# Patient Record
Sex: Female | Born: 1980
Health system: Southern US, Community
[De-identification: ages and names within clinical notes are randomized; demographics above are authoritative.]

## PROBLEM LIST (undated history)

## (undated) DIAGNOSIS — F419 Anxiety disorder, unspecified: Secondary | ICD-10-CM

## (undated) DIAGNOSIS — N760 Acute vaginitis: Secondary | ICD-10-CM

## (undated) DIAGNOSIS — O24419 Gestational diabetes mellitus in pregnancy, unspecified control: Secondary | ICD-10-CM

## (undated) DIAGNOSIS — I471 Supraventricular tachycardia, unspecified: Secondary | ICD-10-CM

## (undated) DIAGNOSIS — B9689 Other specified bacterial agents as the cause of diseases classified elsewhere: Secondary | ICD-10-CM

## (undated) DIAGNOSIS — J40 Bronchitis, not specified as acute or chronic: Secondary | ICD-10-CM

## (undated) DIAGNOSIS — I499 Cardiac arrhythmia, unspecified: Secondary | ICD-10-CM

## (undated) DIAGNOSIS — I10 Essential (primary) hypertension: Secondary | ICD-10-CM

## (undated) DIAGNOSIS — J45909 Unspecified asthma, uncomplicated: Secondary | ICD-10-CM

## (undated) DIAGNOSIS — E119 Type 2 diabetes mellitus without complications: Secondary | ICD-10-CM

## (undated) DIAGNOSIS — E663 Overweight: Secondary | ICD-10-CM

## (undated) DIAGNOSIS — E7800 Pure hypercholesterolemia, unspecified: Secondary | ICD-10-CM

## (undated) HISTORY — PX: INDUCED ABORTION: SHX677

## (undated) HISTORY — DX: Overweight: E66.3

## (undated) HISTORY — PX: TONSILLECTOMY: SUR1361

## (undated) HISTORY — DX: Supraventricular tachycardia, unspecified: I47.10

## (undated) HISTORY — DX: Type 2 diabetes mellitus without complications: E11.9

## (undated) HISTORY — DX: Supraventricular tachycardia: I47.1

## (undated) HISTORY — PX: TUBAL LIGATION: SHX77

---
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
## 2000-05-03 ENCOUNTER — Emergency Department (HOSPITAL_COMMUNITY): Admission: EM | Admit: 2000-05-03 | Discharge: 2000-05-03 | Payer: Self-pay

## 2000-06-21 ENCOUNTER — Emergency Department (HOSPITAL_COMMUNITY): Admission: EM | Admit: 2000-06-21 | Discharge: 2000-06-21 | Payer: Self-pay | Admitting: Emergency Medicine

## 2000-06-23 ENCOUNTER — Emergency Department (HOSPITAL_COMMUNITY): Admission: EM | Admit: 2000-06-23 | Discharge: 2000-06-23 | Payer: Self-pay | Admitting: Emergency Medicine

## 2000-11-10 ENCOUNTER — Emergency Department (HOSPITAL_COMMUNITY): Admission: EM | Admit: 2000-11-10 | Discharge: 2000-11-10 | Payer: Self-pay | Admitting: *Deleted

## 2002-01-21 ENCOUNTER — Emergency Department (HOSPITAL_COMMUNITY): Admission: EM | Admit: 2002-01-21 | Discharge: 2002-01-21 | Payer: Self-pay | Admitting: Emergency Medicine

## 2002-08-12 ENCOUNTER — Emergency Department (HOSPITAL_COMMUNITY): Admission: EM | Admit: 2002-08-12 | Discharge: 2002-08-13 | Payer: Self-pay | Admitting: Emergency Medicine

## 2003-04-17 ENCOUNTER — Inpatient Hospital Stay (HOSPITAL_COMMUNITY): Admission: AD | Admit: 2003-04-17 | Discharge: 2003-04-17 | Payer: Self-pay | Admitting: Obstetrics & Gynecology

## 2004-11-22 ENCOUNTER — Emergency Department (HOSPITAL_COMMUNITY): Admission: EM | Admit: 2004-11-22 | Discharge: 2004-11-22 | Payer: Self-pay | Admitting: Emergency Medicine

## 2005-05-06 ENCOUNTER — Emergency Department (HOSPITAL_COMMUNITY): Admission: EM | Admit: 2005-05-06 | Discharge: 2005-05-06 | Payer: Self-pay | Admitting: Emergency Medicine

## 2006-03-15 ENCOUNTER — Other Ambulatory Visit: Admission: RE | Admit: 2006-03-15 | Discharge: 2006-03-15 | Payer: Self-pay | Admitting: Obstetrics and Gynecology

## 2006-05-03 ENCOUNTER — Ambulatory Visit (HOSPITAL_COMMUNITY): Admission: RE | Admit: 2006-05-03 | Discharge: 2006-05-03 | Payer: Self-pay | Admitting: Obstetrics and Gynecology

## 2006-06-15 ENCOUNTER — Emergency Department (HOSPITAL_COMMUNITY): Admission: EM | Admit: 2006-06-15 | Discharge: 2006-06-15 | Payer: Self-pay | Admitting: Emergency Medicine

## 2006-07-03 ENCOUNTER — Encounter: Admission: RE | Admit: 2006-07-03 | Discharge: 2006-08-10 | Payer: Self-pay | Admitting: Obstetrics and Gynecology

## 2006-08-10 ENCOUNTER — Inpatient Hospital Stay (HOSPITAL_COMMUNITY): Admission: AD | Admit: 2006-08-10 | Discharge: 2006-08-10 | Payer: Self-pay | Admitting: Obstetrics and Gynecology

## 2006-08-11 ENCOUNTER — Inpatient Hospital Stay (HOSPITAL_COMMUNITY): Admission: AD | Admit: 2006-08-11 | Discharge: 2006-08-15 | Payer: Self-pay | Admitting: Obstetrics and Gynecology

## 2006-08-12 ENCOUNTER — Encounter (INDEPENDENT_AMBULATORY_CARE_PROVIDER_SITE_OTHER): Payer: Self-pay | Admitting: Specialist

## 2006-09-21 ENCOUNTER — Other Ambulatory Visit: Admission: RE | Admit: 2006-09-21 | Discharge: 2006-09-21 | Payer: Self-pay | Admitting: Obstetrics and Gynecology

## 2007-01-23 ENCOUNTER — Other Ambulatory Visit: Admission: RE | Admit: 2007-01-23 | Discharge: 2007-01-23 | Payer: Self-pay | Admitting: Obstetrics and Gynecology

## 2007-02-02 ENCOUNTER — Inpatient Hospital Stay (HOSPITAL_COMMUNITY): Admission: AD | Admit: 2007-02-02 | Discharge: 2007-02-02 | Payer: Self-pay | Admitting: Obstetrics and Gynecology

## 2007-04-09 ENCOUNTER — Ambulatory Visit (HOSPITAL_COMMUNITY): Admission: RE | Admit: 2007-04-09 | Discharge: 2007-04-09 | Payer: Self-pay | Admitting: Obstetrics and Gynecology

## 2007-04-30 ENCOUNTER — Ambulatory Visit (HOSPITAL_COMMUNITY): Admission: RE | Admit: 2007-04-30 | Discharge: 2007-04-30 | Payer: Self-pay | Admitting: Obstetrics and Gynecology

## 2007-05-31 ENCOUNTER — Encounter: Admission: RE | Admit: 2007-05-31 | Discharge: 2007-06-01 | Payer: Self-pay | Admitting: Obstetrics and Gynecology

## 2007-08-03 ENCOUNTER — Inpatient Hospital Stay (HOSPITAL_COMMUNITY): Admission: AD | Admit: 2007-08-03 | Discharge: 2007-08-06 | Payer: Self-pay | Admitting: Obstetrics and Gynecology

## 2007-08-03 ENCOUNTER — Encounter (INDEPENDENT_AMBULATORY_CARE_PROVIDER_SITE_OTHER): Payer: Self-pay | Admitting: Obstetrics and Gynecology

## 2007-08-10 ENCOUNTER — Inpatient Hospital Stay (HOSPITAL_COMMUNITY): Admission: AD | Admit: 2007-08-10 | Discharge: 2007-08-10 | Payer: Self-pay | Admitting: Obstetrics and Gynecology

## 2007-08-11 ENCOUNTER — Inpatient Hospital Stay (HOSPITAL_COMMUNITY): Admission: AD | Admit: 2007-08-11 | Discharge: 2007-08-11 | Payer: Self-pay | Admitting: Obstetrics and Gynecology

## 2007-08-12 ENCOUNTER — Inpatient Hospital Stay (HOSPITAL_COMMUNITY): Admission: AD | Admit: 2007-08-12 | Discharge: 2007-08-12 | Payer: Self-pay | Admitting: Obstetrics and Gynecology

## 2008-08-14 ENCOUNTER — Emergency Department (HOSPITAL_COMMUNITY): Admission: EM | Admit: 2008-08-14 | Discharge: 2008-08-14 | Payer: Self-pay | Admitting: Emergency Medicine

## 2008-10-01 ENCOUNTER — Emergency Department (HOSPITAL_COMMUNITY): Admission: EM | Admit: 2008-10-01 | Discharge: 2008-10-01 | Payer: Self-pay | Admitting: Emergency Medicine

## 2009-06-25 ENCOUNTER — Emergency Department (HOSPITAL_COMMUNITY): Admission: EM | Admit: 2009-06-25 | Discharge: 2009-06-26 | Payer: Self-pay | Admitting: Emergency Medicine

## 2009-08-20 ENCOUNTER — Emergency Department (HOSPITAL_COMMUNITY): Admission: EM | Admit: 2009-08-20 | Discharge: 2009-08-21 | Payer: Self-pay | Admitting: Emergency Medicine

## 2010-06-24 ENCOUNTER — Emergency Department (HOSPITAL_COMMUNITY)
Admission: EM | Admit: 2010-06-24 | Discharge: 2010-06-24 | Payer: Self-pay | Source: Home / Self Care | Admitting: Emergency Medicine

## 2010-08-19 ENCOUNTER — Emergency Department (HOSPITAL_COMMUNITY): Payer: Self-pay

## 2010-08-19 ENCOUNTER — Emergency Department (HOSPITAL_COMMUNITY)
Admission: EM | Admit: 2010-08-19 | Discharge: 2010-08-19 | Disposition: A | Discharge status: Home / Self Care | Payer: Self-pay | Attending: Emergency Medicine | Admitting: Emergency Medicine

## 2010-08-19 DIAGNOSIS — R05 Cough: Secondary | ICD-10-CM | POA: Insufficient documentation

## 2010-08-19 DIAGNOSIS — R509 Fever, unspecified: Secondary | ICD-10-CM | POA: Insufficient documentation

## 2010-08-19 DIAGNOSIS — F17200 Nicotine dependence, unspecified, uncomplicated: Secondary | ICD-10-CM | POA: Insufficient documentation

## 2010-08-19 DIAGNOSIS — R070 Pain in throat: Secondary | ICD-10-CM | POA: Insufficient documentation

## 2010-08-19 DIAGNOSIS — J40 Bronchitis, not specified as acute or chronic: Secondary | ICD-10-CM | POA: Insufficient documentation

## 2010-08-19 DIAGNOSIS — R059 Cough, unspecified: Secondary | ICD-10-CM | POA: Insufficient documentation

## 2010-08-19 LAB — POCT PREGNANCY, URINE: Preg Test, Ur: NEGATIVE

## 2010-08-28 ENCOUNTER — Emergency Department (HOSPITAL_COMMUNITY)
Admission: EM | Admit: 2010-08-28 | Discharge: 2010-08-28 | Disposition: A | Discharge status: Home / Self Care | Payer: Self-pay | Attending: Emergency Medicine | Admitting: Emergency Medicine

## 2010-08-28 DIAGNOSIS — R059 Cough, unspecified: Secondary | ICD-10-CM | POA: Insufficient documentation

## 2010-08-28 DIAGNOSIS — R05 Cough: Secondary | ICD-10-CM | POA: Insufficient documentation

## 2010-08-28 DIAGNOSIS — F17200 Nicotine dependence, unspecified, uncomplicated: Secondary | ICD-10-CM | POA: Insufficient documentation

## 2010-08-28 DIAGNOSIS — J3489 Other specified disorders of nose and nasal sinuses: Secondary | ICD-10-CM | POA: Insufficient documentation

## 2010-08-28 DIAGNOSIS — J069 Acute upper respiratory infection, unspecified: Secondary | ICD-10-CM | POA: Insufficient documentation

## 2010-08-28 DIAGNOSIS — R062 Wheezing: Secondary | ICD-10-CM | POA: Insufficient documentation

## 2010-09-29 ENCOUNTER — Inpatient Hospital Stay (INDEPENDENT_AMBULATORY_CARE_PROVIDER_SITE_OTHER)
Admission: RE | Admit: 2010-09-29 | Discharge: 2010-09-29 | Disposition: A | Discharge status: Home / Self Care | Payer: Self-pay | Source: Ambulatory Visit | Attending: Family Medicine | Admitting: Family Medicine

## 2010-09-29 DIAGNOSIS — J40 Bronchitis, not specified as acute or chronic: Secondary | ICD-10-CM

## 2010-09-29 DIAGNOSIS — J069 Acute upper respiratory infection, unspecified: Secondary | ICD-10-CM

## 2010-10-26 NOTE — Op Note (Signed)
NAME:  Valerie [name], Valerie [name] NO.:  [id number]   MEDICAL RECORD NO.:  [id number]          PATIENT TYPE:  INP   LOCATION:  9118                          FACILITY:  [facility]   PHYSICIAN:  Randye Lobo, M.D.   DATE OF BIRTH:  April 08, 1981   DATE OF PROCEDURE:  08/03/2007  DATE OF DISCHARGE:                               OPERATIVE REPORT   PREOPERATIVE DIAGNOSIS:  1. Intrauterine gestation at 36 weeks.  2. Latent phase of labor.  3. Gestational diabetes mellitus, insulin requiring.  4. Homero Fellers breech presentation.  5. Desire for permanent sterilization.   POSTOPERATIVE DIAGNOSIS:  1. Intrauterine gestation at 36 weeks.  2. Latent phase of labor.  3. Gestational diabetes mellitus, insulin requiring.  4. Homero Fellers breech presentation.  5. Desire for permanent sterilization.   PROCEDURE:  Repeat low transverse cesarean section with small T incision  into the uterine fundus with bilateral tubal ligation modified Pomeroy  technique.   SURGEON:  Randye Lobo, M.D.   ASSISTANT:  Miguel Aschoff, M.D.   ANESTHESIA:  Spinal.   INTRAVENOUS FLUIDS:  2300 mL Ringer's lactate.   ESTIMATED BLOOD LOSS:  700 mL.   URINE OUTPUT:  200 mL.   COMPLICATIONS:  None.   INDICATIONS FOR PROCEDURE:  The patient is a 30 year old para 1  Caucasian female at 28 weeks' gestation with known gestational diabetes  mellitus controlled with insulin who presents with contractions.  The  patient does have a history of prior cesarean section and she was  planning on a repeat cesarean section and permanent sterilization at 39  weeks' gestation.  When the patient presented, she was noted to have  elevated blood pressure with systolic blood pressure in the 140 range  and diastolics in the 100 range.  The patient was found to be  contracting regularly and with cervical dilation which was 1 cm and 70%  effaced.  The fetal position could not be confirmed.  The patient  expressed a desire for a trial of the  vaginal delivery if the fetus were  in a vertex presentation.  The patient went on to have pre-eclampsia  laboratory studies which were all negative and her urine demonstrated no  protein.  An ultrasound confirmed a frank breech presentation.  A plan  was made to proceed with the patient's repeat cesarean section with  bilateral tubal ligation after risks, benefits, and alternatives were  reviewed.  The patient understands that the tubal ligation is a  permanent procedure which also may result in a failure rate of  approximately 1 in 250 and 1 in 300.  She understands that this could  result in either an intrauterine or ectopic pregnancy, and she chooses  to proceed.   FINDINGS:  A viable female was delivered at 11:17 a.m. without Apgars of 7  at 1 minute and 9 at 5 minutes.  The presentation was frank breech.  The  weight was 6 pounds 12 ounces.  The amniotic fluid was clear.  The  uterus, tubes and ovaries were unremarkable.   SPECIMENS:  A portion of the right and left fallopian tubes were sent to  pathology and the placenta was sent to pathology, as well.   PROCEDURE IN DETAIL:  The patient was reidentified in the maternity  admissions area.  She was brought down to the operating room where a  spinal anesthetic was administered.  She received Ancef 1 gram IV for  antibiotic prophylaxis.  The abdomen was sterilely prepped and a Foley  catheter was sterilely placed inside the bladder.  She was sterilely  draped.   A Pfannenstiel incision was created sharply with a scalpel.  The  incision was carried down to the fascia using sharp dissection and  monopolar cautery for hemostasis.  The fascia was incised in the midline  and the incision was extended bilaterally with the Mayo scissors.  The  rectus muscles were dissected off of the fascia superiorly and  inferiorly.  The rectus muscles were sharply divided in the midline.  The parietal peritoneum was elevated with two hemostat clamps  and  entered sharply.  The peritoneal incision was extended cranially and  caudally.  There were small adhesions of the bladder over the lower  uterine segment which were lysed with a combination of sharp and blunt  dissection.  The bladder flap was then sharply created and the lower  uterine segment was exposed.  A transverse lower uterine segment  incision was performed sharply with a scalpel and the incision was  extended bilaterally in an upward fashion with the bandage scissors.   A hand was inserted through the uterine incision and the breech was  delivered through the incision with the assistance of fundal pressure.  Each of the legs were delivered and the arms were rotated across the  chest.  Next, an attempt was made for delivery of the vertex which  needed to be facilitated by a small T incision of the uterine incision  into the fundal region.  This allowed for delivery.  The umbilical cord  was doubly clamped and cut and the newborn was handed over to the  awaiting pediatricians.  The placenta was manually extracted and sent to  pathology.   The uterus was exteriorized at this time. It was wiped clean with a  moistened lap pad.  The uterine incision was closed with a double layer  closure of #1 chromic.  The first was a running locked layer and the  second was an imbricating layer.  The small extension of the uterine  incision into the fundus could be easily closed with the standard  closure of the uterus, as the extension was very small.  There was a  small defect noted in the myometrium of the lower uterine segment and  this was repaired with a vertical mattress suture of #1 chromic.   Attention was then turned to the fallopian tubes for the tubal ligation.  The right fallopian tube was grasped with a Babcock clamp and followed  all the way to its fimbriated end.  Two free ties of 0 plain gut suture  were then placed along the isthmic portion of the fallopian tube and  the  intervening portion of the tube was sharply excised and sent to  pathology.  The same procedure that was performed on the right fallopian  tube was then repeated on the left fallopian tube after it was followed  all the way to its fimbriated end.   The uterus was returned to the peritoneal cavity at this time.  The  tubal sites were re-examined and found to be intact and hemostatic.  The  uterine incision was hemostatic as well, and the abdomen was, therefore,  closed.  The parietal peritoneum was closed with a running suture of 2-0  Vicryl.  The rectus muscles were reapproximated in the midline with  interrupted sutures of #1 chromic.  The fascia was closed with a running  suture of 0 Vicryl.  The subcutaneous layer was irrigated and suctioned  and made hemostatic with monopolar cautery.  The skin was closed with a  subcuticular suture of 3-0 Vicryl.  A sterile bandage was placed over  the incision.  This concluded the patient's procedure.  There were no  complications.  All needle, instrument, and sponge counts were correct.  The patient is escorted to the recovery room in stable and awake  condition.      Randye Lobo, M.D.  Electronically Signed     BES/MEDQ  D:  08/03/2007  T:  08/04/2007  Job:  [id number]

## 2010-10-29 NOTE — Op Note (Signed)
NAME:  Valerie Valerie [name], Valerie [name] NO.:  [id number]   MEDICAL RECORD NO.:  [id number]          PATIENT TYPE:  INP   LOCATION:  9126                          FACILITY:  [facility]   PHYSICIAN:  Ilda Mori, M.D.   DATE OF BIRTH:  January 21, 1981   DATE OF PROCEDURE:  08/12/2006  DATE OF DISCHARGE:                               OPERATIVE REPORT   PREOPERATIVE DIAGNOSES:  1. Failure to progress.  2. Pregnancy 35 weeks.  3. Insulin-dependent gestational diabetes mellitus.   POSTOPERATIVE DIAGNOSES:  1. Failure to progress.  2. Pregnancy 35 weeks.  3. Insulin-dependent gestational diabetes mellitus.   PROCEDURE:  Primary low transverse cesarean section.   SURGEON:  Ilda Mori, M.D.   ASSISTANT:  Bing Neighbors. Sydnee Cabal, M.D.   ANESTHESIA:  Epidural.   SPECIMENS:  Placenta to pathology.   ESTIMATED BLOOD LOSS:  1000 mL.   FINDINGS:  A female infant, Apgar scores 6 and 8, birth weight 5 pounds  8 ounces.   COMPLICATIONS:  None.   INDICATIONS:  This is a 30 year old gravida 2, para 50 female who  presented with preterm cervical dilatation and active labor.  The cervix  was 4 cm dilated, completely effaced.  It was felt that it would be  difficult to inhibit labor at this time and that at 35-1/2 weeks, the  benefit of doing so may be outweighed by the risk of continuing the  pregnancy in a patient with insulin-dependent gestational diabetes.  The  patient requested epidural for pain after which her labor became  ineffective and no cervical change was noted.  IV Pitocin was begun  and  after 6 hours there was still no significant change in the cervix.  Artificial rupture of membranes was performed and another 6 hours passed  with a slow progress to 9 cm dilation.  The patient also began to have  moderate to severe variables with each  contraction.  On evaluation it  was felt that the vertex was arrested, possibly due to acyclitism or  occiposterior  presentation.  An attempt  to manually rotate the vertex  to OA was unsuccessful.  Despite the fact that the patient had an  estimated fetal weight 5-1/2 pounds on ultrasound and was 4 weeks early,  there seemed to be no progress toward vaginal delivery and the decision  was made to proceed with cesarean section.   PROCEDURE:  The patient is taken to the operating room and the epidural  anesthesia that had been used for labor anesthesia was then used for  surgical anesthesia.  The abdomen was prepped and draped in sterile  fashion.  Low transverse incision was made and carried down to the  fascia which was then extended transversely.  The rectus muscle was  divided from the overlying rectus sheath and rectus muscle divided in  the midline.  The peritoneum was entered by blunt dissection and then  enlarged bluntly.  The lower segment was identified and the bladder  plane was incised and the bladder was displaced inferiorly.  The lower  segment was entered sharply.  The head was well down in the pelvis and  was delivered with some difficulty.  The shoulders were then delivered,  also with some difficulty.  The cord bloods were obtained.  Placenta  delivered easily.  The uterus bluntly curettaged.  The lower segment was  closed with running interlocking Vicryl suture.  An extension was noted  at the paravaginal area on the right.  This was closed with running  interlocking Vicryl suture.  Bleeding was noted at the corner of the  incision on the right and this was controlled with a figure-of-eight  suture.  The broad ligament was palpated and the suture did not go into  the broad ligament.  An imbricating stitch was then used to further  close the uterine incision.  Hemostasis was noted present.  The  peritoneum was closed with running 3-0 Vicryl suture which also  reapposed the rectus muscle in the midline.  The fascia was closed with  a running Vicryl suture.  The subcutaneous tissue was reapposed with 2-0  plain  catgut suture and the skin was closed with staples.  The patient  tolerated the procedure well and left the operating room in good  condition.      Ilda Mori, M.D.  Electronically Signed     RK/MEDQ  D:  08/12/2006  T:  08/12/2006  Job:  [id number]   cc:   Fayrene Fearing A. Ashley Royalty, M.D.  Fax: [phone]   Charles A. Sydnee Cabal, MD  Fax: [phone]

## 2010-10-29 NOTE — Discharge Summary (Signed)
NAME:  [name], Valerie [name] NO.:  [id number]   MEDICAL RECORD NO.:  [id number]          PATIENT TYPE:  INP   LOCATION:  9126                          FACILITY:  [facility]   PHYSICIAN:  Rudy Jew. Ashley Royalty, Valerie.D.DATE OF BIRTH:  May 30, 1981   DATE OF ADMISSION:  08/11/2006  DATE OF DISCHARGE:  08/15/2006                               DISCHARGE SUMMARY   DISCHARGE DIAGNOSES:  1. Intrauterine pregnancy at approximately 35-4/7 weeks' gestation,      delivered.  2. Preterm labor.  3. Insulin-dependent gestational diabetes.  4. History of methicillin-resistant Staph aureus on skin abscess from      buttocks.  5. Mild pregnancy-induced hypertension diagnosed by Dr. Arlyce Dice.  6. Arrest disorder of dilatation in labor.   OPERATIONS AND SPECIAL PROCEDURES:  Primary low transverse cesarean  section per Dr. Arlyce Dice.   CONSULTATIONS:  None.   DISCHARGE MEDICATIONS:  1. Percocet.  2. Motrin 600 mg.   HISTORY AND PHYSICAL:  This 30 year old gravida 2, para 0-0-1-0 was  admitted in preterm labor by Dr. Arlyce Dice with the aforementioned  diagnoses.  The patient was given antibiotic prophylaxis for unknown  group B strep status.  She went on to labor and ultimately was diagnosed  with an arrest disorder of dilatation in labor.  She was taken to the  operating room by Dr. Arlyce Dice and underwent primary low transverse  cesarean section on August 12, 2006.  The procedure was uncomplicated.  The patient had some modest and transient blood pressure elevations.  PIH panel was benign.  Her Accu-Cheks were within acceptable ranges.  She was felt to be stable for discharge on August 15, 2006 and was  discharged home, afebrile and in satisfactory condition.   DISPOSITION:  The patient is return to [facility] in approximately 3 days for followup.      James A. Ashley Royalty, Valerie.D.  Electronically Signed     JAM/MEDQ  D:  09/13/2006  T:  09/13/2006  Job:  [id number]

## 2010-10-29 NOTE — Discharge Summary (Signed)
NAME:  Valerie [name], Valerie [name] NO.:  [id number]   MEDICAL RECORD NO.:  [id number]          PATIENT TYPE:  MAT   LOCATION:  [facility]                          FACILITY:  [facility]   PHYSICIAN:  Malva Limes, M.D.    DATE OF BIRTH:  March 09, 1981   DATE OF ADMISSION:  08/03/2007  DATE OF DISCHARGE:  08/06/2007                               DISCHARGE SUMMARY   FINAL DIAGNOSIS:  Intrauterine gestation at 36 weeks, latent phase of  labor, gestational diabetes mellitus insulin requiring, frank breech  presentation, and desires her permanent sterilization.   PROCEDURE:  Repeat low-transverse cesarean section with a small T-  incision into the uterine fundus with bilateral tubal ligation using the  modified Pomeroy technique.   SURGEON:  Randye Lobo, M.D..   ASSISTANT:  Miguel Aschoff, M.D.   COMPLICATIONS:  None.   This 30 year old G3, P0-1-1-1 presents at [redacted] weeks gestation with  contractions.  The patient had had a history of a prior cesarean section  with her last pregnancy secondary to failure to progress.  The patient  is also a known gestational diabetic, requiring insulin.  At her repeat  cesarean section that was already planned to be scheduled later in the  pregnancy, she did want permanent sterilization as well.  Upon  admission, the patient's cervix was about 1 cm dilated, 70% effaced.  The patient did want a VBAC as the fetus was in the vertex position, but  ultrasound confirmed a breech presentation.  At this point, a discussion  was held with the patient, the need to proceed with a cesarean section.  Up to this point, like I said, the patient's antepartum course had been  complicated by a diagnosis of gestational diabetes mellitus.  She was on  insulin and controlled through this.  She has also signed her papers to  receive a postpartum tubal ligation.  She is also a smoker who has been  counseled on gestation throughout her pregnancy.  Otherwise, the  patient's antepartum  course up to this point had been otherwise  uneventful.  The patient was taken to the operating room on August 03, 2007, by Dr. Randye Lobo where a repeat low-transverse cesarean  section with a small T-incision was performed.  The patient had the  delivery of a 6-pound 12-ounce female infant with Apgars of 7 and 9.  The  delivery went without complications, and patient still expressed her  desire for permanent sterilization, which was performed using the  modified Pomeroy technique.  Delivery and postpartum tubal ligation went  without complication.  The patient's postoperative course was mostly  uncomplicated.  She did have some mildly elevated blood pressures, which  were monitored during her postpartum course, but did not seem to get any  worse.  The patient also received some insulin for her gestational  diabetes postoperatively, and her blood sugars were being monitored  frequently.  By postoperative day number 3, the patient was felt ready  for discharge.  She was sent home to follow more of an ADA-type diet and  to call if her blood sugars were above 150.  She was to continue her  prenatal vitamins, was given Percocet 1-2 every 4 hours as needed for  pain and to follow up in our office in 4 weeks.  Instruction and  precautions were reviewed with the patient.   Labs on discharge, the patient had a hemoglobin of 11.3, white blood  cell count of 13.4, platelets of 160,000, and a normal PIH panel.  Group  B strep culture obtained on August 03, 2007, did came back positive as  well.      [name], P.A.-C.    ______________________________  Malva Limes, M.D.    MB/MEDQ  D:  08/29/2007  T:  08/30/2007  Job:  [id number]

## 2011-03-04 LAB — URINE MICROSCOPIC-ADD ON

## 2011-03-04 LAB — COMPREHENSIVE METABOLIC PANEL
ALT: 18
BUN: 5 — ABNORMAL LOW
CO2: 22
Chloride: 102
Potassium: 4.1
Sodium: 134 — ABNORMAL LOW

## 2011-03-04 LAB — URINALYSIS, ROUTINE W REFLEX MICROSCOPIC
Leukocytes, UA: NEGATIVE
Protein, ur: NEGATIVE
pH: 7.5

## 2011-03-04 LAB — CBC
Hemoglobin: 13.8
MCHC: 35
MCHC: 35.3
Platelets: 160
Platelets: 208
RBC: 3.8 — ABNORMAL LOW
RDW: 14.4
WBC: 11.5 — ABNORMAL HIGH
WBC: 13.4 — ABNORMAL HIGH

## 2011-03-04 LAB — URIC ACID: Uric Acid, Serum: 4.8

## 2011-03-04 LAB — STREP B DNA PROBE: Strep Group B Ag: POSITIVE

## 2011-03-04 LAB — RPR: RPR Ser Ql: NONREACTIVE

## 2011-03-04 LAB — WOUND CULTURE

## 2011-03-25 LAB — POCT PREGNANCY, URINE
Operator id: 113551
Preg Test, Ur: POSITIVE

## 2011-03-25 LAB — URINALYSIS, ROUTINE W REFLEX MICROSCOPIC
Glucose, UA: NEGATIVE
Leukocytes, UA: NEGATIVE
pH: 6.5

## 2011-04-27 ENCOUNTER — Inpatient Hospital Stay (HOSPITAL_COMMUNITY): Payer: Self-pay

## 2011-04-27 ENCOUNTER — Encounter (HOSPITAL_COMMUNITY): Payer: Self-pay | Admitting: *Deleted

## 2011-04-27 ENCOUNTER — Inpatient Hospital Stay (HOSPITAL_COMMUNITY)
Admission: AD | Admit: 2011-04-27 | Discharge: 2011-04-27 | Disposition: A | Discharge status: Home / Self Care | Payer: Self-pay | Source: Ambulatory Visit | Attending: Obstetrics & Gynecology | Admitting: Obstetrics & Gynecology

## 2011-04-27 DIAGNOSIS — A599 Trichomoniasis, unspecified: Secondary | ICD-10-CM

## 2011-04-27 DIAGNOSIS — N739 Female pelvic inflammatory disease, unspecified: Secondary | ICD-10-CM | POA: Insufficient documentation

## 2011-04-27 DIAGNOSIS — A5901 Trichomonal vulvovaginitis: Secondary | ICD-10-CM | POA: Insufficient documentation

## 2011-04-27 DIAGNOSIS — N949 Unspecified condition associated with female genital organs and menstrual cycle: Secondary | ICD-10-CM | POA: Insufficient documentation

## 2011-04-27 DIAGNOSIS — N730 Acute parametritis and pelvic cellulitis: Secondary | ICD-10-CM

## 2011-04-27 DIAGNOSIS — IMO0002 Reserved for concepts with insufficient information to code with codable children: Secondary | ICD-10-CM

## 2011-04-27 DIAGNOSIS — R102 Pelvic and perineal pain: Secondary | ICD-10-CM

## 2011-04-27 HISTORY — DX: Gestational diabetes mellitus in pregnancy, unspecified control: O24.419

## 2011-04-27 HISTORY — DX: Cardiac arrhythmia, unspecified: I49.9

## 2011-04-27 HISTORY — DX: Anxiety disorder, unspecified: F41.9

## 2011-04-27 LAB — URINALYSIS, ROUTINE W REFLEX MICROSCOPIC
Glucose, UA: NEGATIVE mg/dL
Hgb urine dipstick: NEGATIVE
Ketones, ur: NEGATIVE mg/dL
Leukocytes, UA: NEGATIVE
Protein, ur: NEGATIVE mg/dL
Specific Gravity, Urine: >1.03 — ABNORMAL HIGH (ref 1.005–1.030)
Urobilinogen, UA: 0.2 mg/dL (ref 0.0–1.0)

## 2011-04-27 LAB — DIFFERENTIAL
Basophils Absolute: 0 10*3/uL (ref 0.0–0.1)
Eosinophils Absolute: 0.3 10*3/uL (ref 0.0–0.7)
Eosinophils Relative: 3 % (ref 0–5)
Lymphocytes Relative: 40 % (ref 12–46)
Lymphs Abs: 3.2 10*3/uL (ref 0.7–4.0)
Neutrophils Relative %: 52 % (ref 43–77)

## 2011-04-27 LAB — CBC
HCT: 41.7 % (ref 36.0–46.0)
Hemoglobin: 14.5 g/dL (ref 12.0–15.0)
MCV: 82.1 fL (ref 78.0–100.0)
RBC: 5.08 MIL/uL (ref 3.87–5.11)

## 2011-04-27 LAB — COMPREHENSIVE METABOLIC PANEL
ALT: 12 U/L (ref 0–35)
AST: 8 U/L (ref 0–37)
Alkaline Phosphatase: 73 U/L (ref 39–117)
Calcium: 9.6 mg/dL (ref 8.4–10.5)
GFR calc Af Amer: >90 mL/min (ref >90)
Glucose, Bld: 131 mg/dL — ABNORMAL HIGH (ref 70–99)
Potassium: 4.4 mEq/L (ref 3.5–5.1)
Sodium: 137 mEq/L (ref 135–145)
Total Protein: 6.5 g/dL (ref 6.0–8.3)

## 2011-04-27 LAB — WET PREP, GENITAL: Yeast Wet Prep HPF POC: NONE SEEN

## 2011-04-27 MED ORDER — CEFTRIAXONE SODIUM 250 MG IJ SOLR
250.0000 mg | Freq: Once | INTRAMUSCULAR | Status: AC
  Administered 2011-04-27: 250 mg via INTRAMUSCULAR
  Filled 2011-04-27 (×2): qty 250

## 2011-04-27 MED ORDER — SODIUM CHLORIDE 0.9 % IV SOLN: INTRAVENOUS | Status: DC

## 2011-04-27 MED ORDER — CEFTRIAXONE SODIUM 1 G IJ SOLR
1.0000 g | Freq: Once | INTRAMUSCULAR | Status: DC
  Filled 2011-04-27: qty 10

## 2011-04-27 MED ORDER — DOXYCYCLINE HYCLATE 100 MG PO CAPS: 100.0000 mg | ORAL_CAPSULE | Freq: Two times a day (BID) | ORAL | Status: AC

## 2011-04-27 MED ORDER — KETOROLAC TROMETHAMINE 60 MG/2ML IM SOLN
60.0000 mg | Freq: Once | INTRAMUSCULAR | Status: AC
  Administered 2011-04-27: 60 mg via INTRAMUSCULAR
  Filled 2011-04-27: qty 2

## 2011-04-27 MED ORDER — METRONIDAZOLE 500 MG PO TABS: 500.0000 mg | ORAL_TABLET | Freq: Two times a day (BID) | ORAL | Status: AC

## 2011-04-27 NOTE — ED Notes (Signed)
Pain is at 2 now but during sex and  occasionally just "out of the blue" with have a sudden onset of pain rated @ 10; Last pap smear was 3 yrs ago;

## 2011-04-27 NOTE — ED Notes (Signed)
RN gave Toradol injection in department;

## 2011-04-27 NOTE — Progress Notes (Signed)
Patient states she has been having pain with intercourse for about 2-3 months. Same partner that she has had for a long time. Occasionally has spotting after intercourse. No vaginal discharge or bleeding at this time.

## 2011-04-27 NOTE — ED Provider Notes (Signed)
History     CSN: [id number] Arrival date & time: 04/27/2011 10:01 AM   None     Chief Complaint  Patient presents with  . Pelvic Pain   HPI Valerie [name] is a 30 y.o. female who presents to [facility] for lower abdominal pain that started 3 months ago and is getting worse. Pain increases when tries to have intercourse.  Noted bleeding after sex on some occasions. The history was provided by the patient.  Past Medical History  Diagnosis Date  . Dysrhythmia   . Gestational diabetes   . Anxiety     Past Surgical History  Procedure Date  . Induced abortion   . Cesarean section   . Tonsillectomy     No family history on file.  History  Substance Use Topics  . Smoking status: Current Everyday Smoker -- 1.0 packs/day  . Smokeless tobacco: Not on file  . Alcohol Use: No    OB History    Grav Para Term Preterm Abortions TAB SAB Ect Mult Living   3 2 2  1 1    2       Review of Systems  Constitutional: Positive for unexpected weight change. Negative for fever, chills, diaphoresis and fatigue.  HENT: Negative for ear pain, congestion, sore throat, facial swelling, neck pain, neck stiffness, dental problem and sinus pressure.   Eyes: Negative for photophobia, pain and discharge.  Respiratory: Positive for cough. Negative for chest tightness and wheezing.   Cardiovascular: Negative.   Gastrointestinal: Positive for abdominal pain. Negative for nausea, vomiting, diarrhea, constipation and abdominal distention.  Genitourinary: Positive for vaginal bleeding. Negative for dysuria, frequency, flank pain, vaginal discharge, difficulty urinating and vaginal pain.  Musculoskeletal: Negative for myalgias, back pain and gait problem.  Skin: Negative for color change and rash.  Neurological: Positive for headaches. Negative for dizziness, speech difficulty, weakness, light-headedness and numbness.  Psychiatric/Behavioral: Negative for confusion and agitation.    Allergies  Review of  patient's allergies indicates no known allergies.  Home Medications   Current Outpatient Rx  Name Route Sig Dispense Refill  . DOXYCYCLINE HYCLATE 100 MG PO CAPS Oral Take 1 capsule (100 mg total) by mouth 2 (two) times daily. 20 capsule 0  . METRONIDAZOLE 500 MG PO TABS Oral Take 1 tablet (500 mg total) by mouth 2 (two) times daily. 14 tablet 0    BP 124/79  Pulse 69  Temp(Src) 98.1 F (36.7 C) (Oral)  Resp 18  Ht 5\' 7"  (1.702 m)  Wt 237 lb 9.6 oz (107.775 kg)  BMI 37.21 kg/m2  SpO2 96%  LMP 03/28/2011  Physical Exam  Nursing note and vitals reviewed. Constitutional: She is oriented to person, place, and time. She appears well-developed and well-nourished. No distress.  HENT:  Head: Normocephalic.  Eyes: EOM are normal.  Neck: Neck supple.  Pulmonary/Chest: Effort normal.  Abdominal: Soft. There is no tenderness.  Musculoskeletal: Normal range of motion.  Neurological: She is alert and oriented to person, place, and time. No cranial nerve deficit.  Skin: Skin is warm and dry.  Psychiatric: She has a normal mood and affect. Her behavior is normal. Judgment and thought content normal.   Results for orders placed during the hospital encounter of 04/27/11 (from the past 24 hour(s))  URINALYSIS, ROUTINE W REFLEX MICROSCOPIC     Status: Abnormal   Collection Time   04/27/11 11:05 AM      Component Value Range   Color, Urine YELLOW  YELLOW  Appearance CLEAR  CLEAR    Specific Gravity, Urine >1.030 (*) 1.005 - 1.030    pH 6.0  5.0 - 8.0    Glucose, UA NEGATIVE  NEGATIVE (mg/dL)   Hgb urine dipstick NEGATIVE  NEGATIVE    Bilirubin Urine NEGATIVE  NEGATIVE    Ketones, ur NEGATIVE  NEGATIVE (mg/dL)   Protein, ur NEGATIVE  NEGATIVE (mg/dL)   Urobilinogen, UA 0.2  0.0 - 1.0 (mg/dL)   Nitrite NEGATIVE  NEGATIVE    Leukocytes, UA NEGATIVE  NEGATIVE   WET PREP, GENITAL     Status: Abnormal   Collection Time   04/27/11 12:00 PM      Component Value Range   Yeast, Wet Prep  NONE SEEN  NONE SEEN    Trich, Wet Prep FEW (*) NONE SEEN    Clue Cells, Wet Prep NONE SEEN  NONE SEEN    WBC, Wet Prep HPF POC FEW (*) NONE SEEN   CBC     Status: Normal   Collection Time   04/27/11 12:10 PM      Component Value Range   WBC 7.9  4.0 - 10.5 (K/uL)   RBC 5.08  3.87 - 5.11 (MIL/uL)   Hemoglobin 14.5  12.0 - 15.0 (g/dL)   HCT 40.9  81.1 - 91.4 (%)   MCV 82.1  78.0 - 100.0 (fL)   MCH 28.5  26.0 - 34.0 (pg)   MCHC 34.8  30.0 - 36.0 (g/dL)   RDW 78.2  95.6 - 21.3 (%)   Platelets 243  150 - 400 (K/uL)  DIFFERENTIAL     Status: Normal   Collection Time   04/27/11 12:10 PM      Component Value Range   Neutrophils Relative 52  43 - 77 (%)   Neutro Abs 4.1  1.7 - 7.7 (K/uL)   Lymphocytes Relative 40  12 - 46 (%)   Lymphs Abs 3.2  0.7 - 4.0 (K/uL)   Monocytes Relative 5  3 - 12 (%)   Monocytes Absolute 0.4  0.1 - 1.0 (K/uL)   Eosinophils Relative 3  0 - 5 (%)   Eosinophils Absolute 0.3  0.0 - 0.7 (K/uL)   Basophils Relative 0  0 - 1 (%)   Basophils Absolute 0.0  0.0 - 0.1 (K/uL)  COMPREHENSIVE METABOLIC PANEL     Status: Abnormal   Collection Time   04/27/11 12:10 PM      Component Value Range   Sodium 137  135 - 145 (mEq/L)   Potassium 4.4  3.5 - 5.1 (mEq/L)   Chloride 100  96 - 112 (mEq/L)   CO2 29  19 - 32 (mEq/L)   Glucose, Bld 131 (*) 70 - 99 (mg/dL)   BUN 7  6 - 23 (mg/dL)   Creatinine, Ser 0.86  0.50 - 1.10 (mg/dL)   Calcium 9.6  8.4 - 57.8 (mg/dL)   Total Protein 6.5  6.0 - 8.3 (g/dL)   Albumin 3.6  3.5 - 5.2 (g/dL)   AST 8  0 - 37 (U/L)   ALT 12  0 - 35 (U/L)   Alkaline Phosphatase 73  39 - 117 (U/L)   Total Bilirubin 0.3  0.3 - 1.2 (mg/dL)   GFR calc non Af Amer >90  >90 (mL/min)   GFR calc Af Amer >90  >90 (mL/min)   Assessment: PID   Trichomonas vaginosis  Plan:  Rocephin 1 gram IV   Doxycycline x 10 days   Flagyl 500 mg  bid x 7 days   Follow up in [facility] ED Course  Procedures    MDM         [facility], NP 04/27/11 1431

## 2011-04-29 NOTE — ED Provider Notes (Signed)
Attestation of Attending Supervision of Advanced Practitioner: Evaluation and management procedures were performed by the PA/NP/CNM/OB Fellow under my supervision/collaboration. Chart reviewed, and agree with management and plan.  Jaynie Collins, M.D. 04/29/2011 2:29 PM

## 2011-08-28 ENCOUNTER — Other Ambulatory Visit: Payer: Self-pay

## 2011-08-28 ENCOUNTER — Encounter (HOSPITAL_COMMUNITY): Payer: Self-pay | Admitting: *Deleted

## 2011-08-28 ENCOUNTER — Emergency Department (HOSPITAL_COMMUNITY)
Admission: EM | Admit: 2011-08-28 | Discharge: 2011-08-28 | Discharge status: Against Medical Advice | Payer: Self-pay | Attending: Emergency Medicine | Admitting: Emergency Medicine

## 2011-08-28 DIAGNOSIS — F17200 Nicotine dependence, unspecified, uncomplicated: Secondary | ICD-10-CM | POA: Insufficient documentation

## 2011-08-28 DIAGNOSIS — R002 Palpitations: Secondary | ICD-10-CM | POA: Insufficient documentation

## 2011-08-28 DIAGNOSIS — F411 Generalized anxiety disorder: Secondary | ICD-10-CM | POA: Insufficient documentation

## 2011-08-28 DIAGNOSIS — Z7982 Long term (current) use of aspirin: Secondary | ICD-10-CM | POA: Insufficient documentation

## 2011-08-28 DIAGNOSIS — I499 Cardiac arrhythmia, unspecified: Secondary | ICD-10-CM | POA: Insufficient documentation

## 2011-08-28 NOTE — ED Notes (Signed)
Pt states that when she arrived at work this am, she began to feel as if her heart was beating too fast.  This continued intermittently x 3 hours.  Pt has not had said feeling since 0900 today.  Pt states that she has a prior hx of the same but attributed that occurrence to a panic attack.  Pt does not currently feel as if her heart is beating abnormally in any way.

## 2011-08-28 NOTE — ED Notes (Signed)
Pt updated about plan of care. Sts that she has children to pick up and "doesn't have time to wait." Pt informed about I-stat and told that she would be waiting at least 1 hour.

## 2011-08-28 NOTE — ED Provider Notes (Signed)
History     CSN: [id number]  Arrival date & time 08/28/11  1952   First MD Initiated Contact with Patient 08/28/11 2103      Chief Complaint  Patient presents with  . Palpitations     HPI  History provided by the patient. Patient is a 31 year old female with history of anxiety who presents with concerns for heart palpitations earlier this morning. Patient reports waking up and shortly after preparing she is ready for work developing racing heart with palpitation and pounding feeling. She reports the symptoms lasted approximately 2 hours until she arrived at work around 9 AM. Symptoms were described as mild to moderate. They were associated with some lightheadedness and periorbital numbness and tingling. Symptoms were not associated with any chest pain, shortness of breath, hemoptysis, nausea, diaphoresis. Patient does report having some similar episodes about 2 months ago that resolved on their own similarly. At that time she thought her symptoms were due to anxiety and panic attack. Patient currently is not on any medication for anxiety. Patient does not have a primary care provider or medical insurance. Patient has no significant family history of early cardiac death. She does have family history for hypertension, diabetes and hyperlipidemia.   Past Medical History  Diagnosis Date  . Dysrhythmia   . Gestational diabetes   . Anxiety     Past Surgical History  Procedure Date  . Induced abortion   . Cesarean section   . Tonsillectomy     No family history on file.  History  Substance Use Topics  . Smoking status: Current Everyday Smoker -- 1.0 packs/day  . Smokeless tobacco: Not on file  . Alcohol Use: No    OB History    Grav Para Term Preterm Abortions TAB SAB Ect Mult Living   3 2 2  1 1    2       Review of Systems  Constitutional: Negative for fever, chills, diaphoresis and fatigue.  Respiratory: Negative for cough and shortness of breath.   Cardiovascular:  Positive for palpitations. Negative for chest pain and leg swelling.  Gastrointestinal: Negative for nausea, vomiting, diarrhea and constipation.  Genitourinary: Negative for dysuria, frequency and hematuria.  All other systems reviewed and are negative.    Allergies  Review of patient's allergies indicates no known allergies.  Home Medications   Current Outpatient Rx  Name Route Sig Dispense Refill  . ALBUTEROL SULFATE HFA 108 (90 BASE) MCG/ACT IN AERS Inhalation Inhale 2 puffs into the lungs every 6 (six) hours as needed. Shortness of breath    . ASPIRIN 81 MG PO CHEW Oral Chew 162 mg by mouth once.      BP 148/82  Pulse 105  Temp(Src) 98.7 F (37.1 C) (Oral)  Resp 18  SpO2 97%  LMP 08/11/2011  Physical Exam  Nursing note and vitals reviewed. Constitutional: She is oriented to person, place, and time. She appears well-developed and well-nourished. No distress.  HENT:  Head: Normocephalic and atraumatic.  Cardiovascular: Normal rate and regular rhythm.   Pulmonary/Chest: Effort normal and breath sounds normal. No respiratory distress. She has no wheezes. She has no rales.  Abdominal: Soft. She exhibits no distension. There is no tenderness. There is no rebound and no guarding.  Neurological: She is alert and oriented to person, place, and time.  Skin: Skin is warm and dry. No rash noted.  Psychiatric: She has a normal mood and affect. Her behavior is normal.    ED Course  Procedures  1. Palpitations       MDM  Patient seen and evaluated. Patient in no acute distress.     Date: 08/28/2011  Rate: 91  Rhythm: normal sinus rhythm  QRS Axis: normal  Intervals: normal  ST/T Wave abnormalities: normal  Conduction Disutrbances:none  Narrative Interpretation:   Old EKG Reviewed: none available          Angus Seller, [place] 08/29/11 [phone]

## 2011-08-28 NOTE — ED Notes (Signed)
Pt not in room x 3  

## 2011-08-31 NOTE — ED Provider Notes (Signed)
Medical screening examination/treatment/procedure(s) were performed by non-physician practitioner and as supervising physician I was immediately available for consultation/collaboration.  Loren Racer, MD 08/31/11 [phone]

## 2012-10-04 ENCOUNTER — Emergency Department (HOSPITAL_COMMUNITY)
Admission: EM | Admit: 2012-10-04 | Discharge: 2012-10-04 | Disposition: A | Discharge status: Home / Self Care | Payer: Self-pay | Attending: Emergency Medicine | Admitting: Emergency Medicine

## 2012-10-04 ENCOUNTER — Encounter (HOSPITAL_COMMUNITY): Payer: Self-pay

## 2012-10-04 DIAGNOSIS — Z8659 Personal history of other mental and behavioral disorders: Secondary | ICD-10-CM | POA: Insufficient documentation

## 2012-10-04 DIAGNOSIS — Z8679 Personal history of other diseases of the circulatory system: Secondary | ICD-10-CM | POA: Insufficient documentation

## 2012-10-04 DIAGNOSIS — Z23 Encounter for immunization: Secondary | ICD-10-CM | POA: Insufficient documentation

## 2012-10-04 DIAGNOSIS — W57XXXA Bitten or stung by nonvenomous insect and other nonvenomous arthropods, initial encounter: Secondary | ICD-10-CM

## 2012-10-04 DIAGNOSIS — Z8632 Personal history of gestational diabetes: Secondary | ICD-10-CM | POA: Insufficient documentation

## 2012-10-04 DIAGNOSIS — Y929 Unspecified place or not applicable: Secondary | ICD-10-CM | POA: Insufficient documentation

## 2012-10-04 DIAGNOSIS — F17200 Nicotine dependence, unspecified, uncomplicated: Secondary | ICD-10-CM | POA: Insufficient documentation

## 2012-10-04 DIAGNOSIS — IMO0002 Reserved for concepts with insufficient information to code with codable children: Secondary | ICD-10-CM | POA: Insufficient documentation

## 2012-10-04 DIAGNOSIS — Y939 Activity, unspecified: Secondary | ICD-10-CM | POA: Insufficient documentation

## 2012-10-04 MED ORDER — TETANUS-DIPHTH-ACELL PERTUSSIS 5-2.5-18.5 LF-MCG/0.5 IM SUSP
0.5000 mL | Freq: Once | INTRAMUSCULAR | Status: AC
  Administered 2012-10-04: 0.5 mL via INTRAMUSCULAR
  Filled 2012-10-04: qty 0.5

## 2012-10-04 NOTE — ED Notes (Signed)
Pt noticed a tick on the back of her leg about 30 minutes ago. They were able to pull the head off of the tick but part of the tick is still stuck in the patients leg behind her right now. Here to get it removed.

## 2012-10-04 NOTE — ED Notes (Signed)
Head of a tick embedded in right popliteal area. Has tried to get it out but area is too tender.

## 2012-10-04 NOTE — ED Provider Notes (Signed)
History    This chart was scribed for non-physician practitioner working with Valerie Cooper III, MD by Donne Anon, ED Scribe. This patient was seen in room TR09C/TR09C and the patient's care was started at 1759.   CSN: [id number]  Arrival date & time 10/04/12  1731   First MD Initiated Contact with Patient 10/04/12 1759      Chief Complaint  Patient presents with  . Tick Removal    The history is provided by the patient. No language interpreter was used.   Valerie [name] is a 32 y.o. female who presents to the Emergency Department complaining of sudden onset tick wound which occurred 30 minutes PTA. She states she noticed a tick bit the back of her leg and when she tried to pull it out only part of it came out. She reports pain to the area upon palpation. She denies fever, chills, rashes or any other pain.  States the tick was definitely not present this morning - she found the tick after walking in the park just a few hours ago.   Her tetanus shot is not UTD. She does not have a PCP.  Past Medical History  Diagnosis Date  . Dysrhythmia   . Gestational diabetes   . Anxiety     Past Surgical History  Procedure Laterality Date  . Induced abortion    . Cesarean section    . Tonsillectomy      No family history on file.  History  Substance Use Topics  . Smoking status: Current Every Day Smoker -- 1.00 packs/day  . Smokeless tobacco: Not on file  . Alcohol Use: No    OB History   Grav Para Term Preterm Abortions TAB SAB Ect Mult Living   3 2 2  1 1    2       Review of Systems  Constitutional: Negative for fever and chills.  Skin: Positive for wound. Negative for rash.  All other systems reviewed and are negative.    Allergies  Review of patient's allergies indicates no known allergies.  Home Medications   Current Outpatient Rx  Name  Route  Sig  Dispense  Refill  . acetaminophen (TYLENOL) 500 MG tablet   Oral   Take 500 mg by mouth every 6 (six)  hours as needed for pain.         Marland Kitchen albuterol (PROVENTIL HFA;VENTOLIN HFA) 108 (90 BASE) MCG/ACT inhaler   Inhalation   Inhale 2 puffs into the lungs every 6 (six) hours as needed. Shortness of breath         . Biotin 10 MG TABS   Oral   Take 1 tablet by mouth daily.           BP 156/103  Pulse 104  Temp(Src) 97.6 F (36.4 C) (Oral)  Resp 18  SpO2 97%  LMP 09/03/2012  Physical Exam  Nursing note and vitals reviewed. Constitutional: She appears well-developed and well-nourished. No distress.  HENT:  Head: Normocephalic and atraumatic.  Neck: Neck supple.  Pulmonary/Chest: Effort normal.  Neurological: She is alert.  Skin: No rash noted. She is not diaphoretic. No erythema.  Posterior right knee with tiny black foreign body consistent with partial tick body present in the skin. No erythema, edema, warmth or discharge present.    ED Course  FOREIGN BODY REMOVAL Date/Time: 10/04/2012 6:41 PM Performed by: Trixie Dredge Authorized by: Trixie Dredge Consent: Verbal consent obtained. Consent given by: patient Patient understanding: patient states understanding of  the procedure being performed Patient identity confirmed: verbally with patient Body area: skin General location: lower extremity Location details: left knee Anesthesia method: none. Patient sedated: no Patient cooperative: yes Removal mechanism: forceps, hemostat and scalpel Dressing: dressing applied Tendon involvement: none Depth: subcutaneous Complexity: simple 1 objects recovered. Objects recovered: tick body Post-procedure assessment: foreign body removed Patient tolerance: Patient tolerated the procedure well with no immediate complications.   (including critical care time) DIAGNOSTIC STUDIES: Oxygen Saturation is 97% on room air, adequate by my interpretation.    COORDINATION OF CARE: 6:08 PM Discussed treatment plan which includes removing the tick and a Tetanus shot with pt at bedside and pt  agreed to plan.     Labs Reviewed - No data to display No results found.   1. Tick bite       MDM  Pt with tick bite of posterior left knee.  Pt believes tick was there no longer than a few hours.  No systemic symptoms, no rash, no local wound infection or cellulitis.  Discussed diagnosis, wound care with patient.  Pt given return precautions.  Pt verbalizes understanding and agrees with plan.      I personally performed the services described in this documentation, which was scribed in my presence. The recorded information has been reviewed and is accurate.         Trixie Dredge, PA-C 10/04/12 1843

## 2012-10-05 NOTE — ED Provider Notes (Signed)
Medical screening examination/treatment/procedure(s) were performed by non-physician practitioner and as supervising physician I was immediately available for consultation/collaboration.    [name], MD 10/05/12 [phone]

## 2013-02-05 ENCOUNTER — Encounter (HOSPITAL_COMMUNITY): Payer: Self-pay | Admitting: Emergency Medicine

## 2013-02-05 ENCOUNTER — Emergency Department (HOSPITAL_COMMUNITY)
Admission: EM | Admit: 2013-02-05 | Discharge: 2013-02-05 | Disposition: A | Discharge status: Home / Self Care | Payer: Self-pay | Attending: Emergency Medicine | Admitting: Emergency Medicine

## 2013-02-05 DIAGNOSIS — K029 Dental caries, unspecified: Secondary | ICD-10-CM | POA: Insufficient documentation

## 2013-02-05 DIAGNOSIS — Z79899 Other long term (current) drug therapy: Secondary | ICD-10-CM | POA: Insufficient documentation

## 2013-02-05 DIAGNOSIS — Z8659 Personal history of other mental and behavioral disorders: Secondary | ICD-10-CM | POA: Insufficient documentation

## 2013-02-05 DIAGNOSIS — K047 Periapical abscess without sinus: Secondary | ICD-10-CM | POA: Insufficient documentation

## 2013-02-05 DIAGNOSIS — Z8679 Personal history of other diseases of the circulatory system: Secondary | ICD-10-CM | POA: Insufficient documentation

## 2013-02-05 DIAGNOSIS — Z791 Long term (current) use of non-steroidal anti-inflammatories (NSAID): Secondary | ICD-10-CM | POA: Insufficient documentation

## 2013-02-05 DIAGNOSIS — Z8632 Personal history of gestational diabetes: Secondary | ICD-10-CM | POA: Insufficient documentation

## 2013-02-05 DIAGNOSIS — F17200 Nicotine dependence, unspecified, uncomplicated: Secondary | ICD-10-CM | POA: Insufficient documentation

## 2013-02-05 MED ORDER — AMOXICILLIN 500 MG PO CAPS: 500.0000 mg | ORAL_CAPSULE | Freq: Three times a day (TID) | ORAL | Status: DC

## 2013-02-05 MED ORDER — OXYCODONE-ACETAMINOPHEN 5-325 MG PO TABS: ORAL_TABLET | ORAL | Status: DC

## 2013-02-05 MED ORDER — KETOROLAC TROMETHAMINE 30 MG/ML IJ SOLN
30.0000 mg | Freq: Once | INTRAMUSCULAR | Status: AC
  Administered 2013-02-05: 30 mg via INTRAVENOUS
  Filled 2013-02-05: qty 1

## 2013-02-05 NOTE — ED Notes (Signed)
Pt c/o bil dental pain x several weeks

## 2013-02-05 NOTE — ED Provider Notes (Signed)
This chart was scribed for Wynetta Emery PA-C, a non-physician practitioner working with Glynn Octave, MD by Lewanda Rife, ED Scribe. This patient was seen in room TR07C/TR07C and the patient's care was started at 1840.    CSN: [id number]     Arrival date & time 02/05/13  1803 History   First MD Initiated Contact with Patient 02/05/13 1827     Chief Complaint  Patient presents with  . Dental Pain   (Consider location/radiation/quality/duration/timing/severity/associated sxs/prior Treatment) HPI HPI Comments: Valerie [name] is a 32 y.o. female who presents to the Emergency Department complaining of constant severe dental pain of multiple teeth onset several weeks. Describes pain as sharp. Denies associated fever. Denies any aggravating or alleviating factors. Reports taking Aleve PTA with no relief of symptoms.   Past Medical History  Diagnosis Date  . Dysrhythmia   . Gestational diabetes   . Anxiety    Past Surgical History  Procedure Laterality Date  . Induced abortion    . Cesarean section    . Tonsillectomy     History reviewed. No pertinent family history. History  Substance Use Topics  . Smoking status: Current Every Day Smoker -- 1.00 packs/day  . Smokeless tobacco: Not on file  . Alcohol Use: No   OB History   Grav Para Term Preterm Abortions TAB SAB Ect Mult Living   3 2 2  1 1    2      Review of Systems 10 systems reviewed and found to be negative, except as noted in the HPI   Allergies  Review of patient's allergies indicates no known allergies.  Home Medications   Current Outpatient Rx  Name  Route  Sig  Dispense  Refill  . albuterol (PROVENTIL HFA;VENTOLIN HFA) 108 (90 BASE) MCG/ACT inhaler   Inhalation   Inhale 2 puffs into the lungs every 6 (six) hours as needed. Shortness of breath         . Biotin 10 MG TABS   Oral   Take 1 tablet by mouth daily.         . naproxen sodium (ANAPROX) 220 MG tablet   Oral   Take 220 mg by  mouth 2 (two) times daily with a meal.          BP 134/96  Pulse 97  Temp(Src) 98.5 F (36.9 C) (Oral)  Resp 18  SpO2 95%  LMP 01/10/2013 Physical Exam  Nursing note and vitals reviewed. Constitutional: She is oriented to person, place, and time. She appears well-developed and well-nourished. No distress.  HENT:  Head: Normocephalic.  Generally poor dentition, no gingival swelling, erythema or tenderness to palpation. Patient is handling their secretions. There is no tenderness to palpation or firmness underneath tongue bilaterally. No trismus.    Eyes: Conjunctivae and EOM are normal.  Cardiovascular: Normal rate.   Pulmonary/Chest: Effort normal. No stridor.  Musculoskeletal: Normal range of motion.  Neurological: She is alert and oriented to person, place, and time.  Psychiatric: She has a normal mood and affect.    ED Course  Procedures (including critical care time) Medications - No data to display  Labs Review Labs Reviewed - No data to display Imaging Review No results found.  MDM  No diagnosis found. MDM Number of Diagnoses or Management Options Dental abscess:  Dental caries:    Filed Vitals:   02/05/13 1821 02/05/13 1924  BP: 134/96 153/98  Pulse: 97 88  Temp: 98.5 F (36.9 C)   TempSrc: Oral  Resp: 18 18  SpO2: 95% 97%     Valerie [name] is a 32 y.o. female Patient with toothache.  No gross abscess.  Exam unconcerning for Ludwig's angina or spread of infection.  Will treat with penicillin and pain medicine.  Urged patient to follow-up with dentist.    Medications  ketorolac (TORADOL) 30 MG/ML injection 30 mg (30 mg Intravenous Given 02/05/13 1925)    Pt is hemodynamically stable, appropriate for, and amenable to discharge at this time. Pt verbalized understanding and agrees with care plan. All questions answered. Outpatient follow-up and specific return precautions discussed.    Discharge Medication List as of 02/05/2013  6:49 PM     START taking these medications   Details  amoxicillin (AMOXIL) 500 MG capsule Take 1 capsule (500 mg total) by mouth 3 (three) times daily., Starting 02/05/2013, Until Discontinued, Print    oxyCODONE-acetaminophen (PERCOCET/ROXICET) 5-325 MG per tablet 1 to 2 tabs PO q6hrs  PRN for pain, Print        I personally performed the services described in this documentation, which was scribed in my presence. The recorded information has been reviewed and is accurate.  Note: Portions of this report may have been transcribed using voice recognition software. Every effort was made to ensure accuracy; however, inadvertent computerized transcription errors may be present   Wynetta Emery, PA-C 02/06/13 0140

## 2013-02-06 NOTE — ED Provider Notes (Signed)
Medical screening examination/treatment/procedure(s) were performed by non-physician practitioner and as supervising physician I was immediately available for consultation/collaboration.   Mary Hockey, MD 02/06/13 0140 

## 2013-02-10 ENCOUNTER — Emergency Department (HOSPITAL_COMMUNITY)
Admission: EM | Admit: 2013-02-10 | Discharge: 2013-02-10 | Disposition: A | Discharge status: Home / Self Care | Payer: Self-pay | Attending: Emergency Medicine | Admitting: Emergency Medicine

## 2013-02-10 ENCOUNTER — Encounter (HOSPITAL_COMMUNITY): Payer: Self-pay | Admitting: Emergency Medicine

## 2013-02-10 DIAGNOSIS — Z8632 Personal history of gestational diabetes: Secondary | ICD-10-CM | POA: Insufficient documentation

## 2013-02-10 DIAGNOSIS — Z791 Long term (current) use of non-steroidal anti-inflammatories (NSAID): Secondary | ICD-10-CM | POA: Insufficient documentation

## 2013-02-10 DIAGNOSIS — Z8659 Personal history of other mental and behavioral disorders: Secondary | ICD-10-CM | POA: Insufficient documentation

## 2013-02-10 DIAGNOSIS — F17200 Nicotine dependence, unspecified, uncomplicated: Secondary | ICD-10-CM | POA: Insufficient documentation

## 2013-02-10 DIAGNOSIS — Z79899 Other long term (current) drug therapy: Secondary | ICD-10-CM | POA: Insufficient documentation

## 2013-02-10 DIAGNOSIS — K089 Disorder of teeth and supporting structures, unspecified: Secondary | ICD-10-CM | POA: Insufficient documentation

## 2013-02-10 DIAGNOSIS — Z792 Long term (current) use of antibiotics: Secondary | ICD-10-CM | POA: Insufficient documentation

## 2013-02-10 DIAGNOSIS — K0889 Other specified disorders of teeth and supporting structures: Secondary | ICD-10-CM

## 2013-02-10 DIAGNOSIS — Z8679 Personal history of other diseases of the circulatory system: Secondary | ICD-10-CM | POA: Insufficient documentation

## 2013-02-10 MED ORDER — KETOROLAC TROMETHAMINE 60 MG/2ML IM SOLN
30.0000 mg | Freq: Once | INTRAMUSCULAR | Status: AC
  Administered 2013-02-10: 30 mg via INTRAMUSCULAR
  Filled 2013-02-10: qty 2

## 2013-02-10 MED ORDER — IBUPROFEN 800 MG PO TABS: 800.0000 mg | ORAL_TABLET | Freq: Three times a day (TID) | ORAL | Status: DC

## 2013-02-10 NOTE — ED Notes (Signed)
Pt states she was seen for dental pain last Tuesday at [facility] ED. Was given antibiotic and pain medication. Pt states she is still taking antibiotic but is out of pain medicine. Pt states she has not had a chance to make an appointment with a dentist but will try to this week.

## 2013-02-10 NOTE — ED Provider Notes (Signed)
CSN: [id number]     Arrival date & time 02/10/13  1932 History   First MD Initiated Contact with Patient 02/10/13 2003     Chief Complaint  Patient presents with  . Dental Pain   (Consider location/radiation/quality/duration/timing/severity/associated sxs/prior Treatment) HPI  32 year old female presents complaining of dental pain. Patient reports persistence right upper and lower molar pain ongoing for several days. Describe pain as a sharp achy sensation it initially started at her right upper teeth radiates across her jaw and now affecting her right lower teeth. Pain is worsened with chewing, and cold air. No complaints of fever, headache, sneezing,coughing, sorethroat, earpain, neckpain,or rash.Denies any recent trauma. Pain has improved after taking pain medication and antibiotic that was prescribed for her several days ago however her medication has ran out. She was giving a referral to dentist but has not followup or call yet. Pt is a smoker.   Past Medical History  Diagnosis Date  . Dysrhythmia   . Gestational diabetes   . Anxiety    Past Surgical History  Procedure Laterality Date  . Induced abortion    . Cesarean section    . Tonsillectomy     No family history on file. History  Substance Use Topics  . Smoking status: Current Every Day Smoker -- 1.00 packs/day  . Smokeless tobacco: Not on file  . Alcohol Use: No   OB History   Grav Para Term Preterm Abortions TAB SAB Ect Mult Living   3 2 2  1 1    2      Review of Systems  Constitutional: Negative for fever.  HENT: Positive for dental problem.   Skin: Negative for rash.    Allergies  Review of patient's allergies indicates no known allergies.  Home Medications   Current Outpatient Rx  Name  Route  Sig  Dispense  Refill  . albuterol (PROVENTIL HFA;VENTOLIN HFA) 108 (90 BASE) MCG/ACT inhaler   Inhalation   Inhale 2 puffs into the lungs every 6 (six) hours as needed. Shortness of breath         .  amoxicillin (AMOXIL) 500 MG capsule   Oral   Take 1 capsule (500 mg total) by mouth 3 (three) times daily.   30 capsule   0   . naproxen sodium (ANAPROX) 220 MG tablet   Oral   Take 220 mg by mouth 2 (two) times daily with a meal.         . oxyCODONE-acetaminophen (PERCOCET/ROXICET) 5-325 MG per tablet      1 to 2 tabs PO q6hrs  PRN for pain   15 tablet   0    BP 159/96  Pulse 93  Temp(Src) 98.3 F (36.8 C) (Oral)  Resp 16  SpO2 97%  LMP 01/27/2013 Physical Exam  Nursing note and vitals reviewed. Constitutional: She appears well-developed and well-nourished. No distress.  HENT:  Mouth/Throat:    No trismus, no evidence of deep tissue infection  Eyes: Conjunctivae are normal.  Neck: Neck supple.  Neurological: She is alert.  Skin: No rash noted.    ED Course  Procedures (including critical care time)  Patient here with worsening dental pain. She is afebrile, no evidence of deep tissue infection. Patient would likely benefit from following up with a dentist for further management. I did offer dental nerve block injection, pt declined.  Will give pain medication here only. Recommend NSAIDs at home.  Labs Review Labs Reviewed - No data to display Imaging Review No results  found.  MDM   1. Pain, dental    BP 159/96  Pulse 93  Temp(Src) 98.3 F (36.8 C) (Oral)  Resp 16  SpO2 97%  LMP 01/27/2013     Fayrene Helper, PA-C 02/10/13 2024

## 2013-02-10 NOTE — ED Provider Notes (Signed)
Medical screening examination/treatment/procedure(s) were performed by non-physician practitioner and as supervising physician I was immediately available for consultation/collaboration.    Vida Roller, MD 02/10/13 2350

## 2013-02-10 NOTE — ED Notes (Signed)
Pt.reports persistent right upper/lower molar pain for several days seen here prescribed with antibiotic and pain medication with no improvement / relief.

## 2013-05-11 ENCOUNTER — Emergency Department (HOSPITAL_COMMUNITY)
Admission: EM | Admit: 2013-05-11 | Discharge: 2013-05-11 | Disposition: A | Discharge status: Home / Self Care | Payer: Self-pay | Attending: Emergency Medicine | Admitting: Emergency Medicine

## 2013-05-11 ENCOUNTER — Encounter (HOSPITAL_COMMUNITY): Payer: Self-pay | Admitting: Emergency Medicine

## 2013-05-11 DIAGNOSIS — Z8659 Personal history of other mental and behavioral disorders: Secondary | ICD-10-CM | POA: Insufficient documentation

## 2013-05-11 DIAGNOSIS — Z8619 Personal history of other infectious and parasitic diseases: Secondary | ICD-10-CM | POA: Insufficient documentation

## 2013-05-11 DIAGNOSIS — Z8632 Personal history of gestational diabetes: Secondary | ICD-10-CM | POA: Insufficient documentation

## 2013-05-11 DIAGNOSIS — Z791 Long term (current) use of non-steroidal anti-inflammatories (NSAID): Secondary | ICD-10-CM | POA: Insufficient documentation

## 2013-05-11 DIAGNOSIS — Z79899 Other long term (current) drug therapy: Secondary | ICD-10-CM | POA: Insufficient documentation

## 2013-05-11 DIAGNOSIS — Z8742 Personal history of other diseases of the female genital tract: Secondary | ICD-10-CM | POA: Insufficient documentation

## 2013-05-11 DIAGNOSIS — R0789 Other chest pain: Secondary | ICD-10-CM | POA: Insufficient documentation

## 2013-05-11 DIAGNOSIS — Z8679 Personal history of other diseases of the circulatory system: Secondary | ICD-10-CM | POA: Insufficient documentation

## 2013-05-11 DIAGNOSIS — F17200 Nicotine dependence, unspecified, uncomplicated: Secondary | ICD-10-CM | POA: Insufficient documentation

## 2013-05-11 DIAGNOSIS — IMO0001 Reserved for inherently not codable concepts without codable children: Secondary | ICD-10-CM | POA: Insufficient documentation

## 2013-05-11 DIAGNOSIS — J209 Acute bronchitis, unspecified: Secondary | ICD-10-CM | POA: Insufficient documentation

## 2013-05-11 HISTORY — DX: Other specified bacterial agents as the cause of diseases classified elsewhere: N76.0

## 2013-05-11 HISTORY — DX: Other specified bacterial agents as the cause of diseases classified elsewhere: B96.89

## 2013-05-11 MED ORDER — AZITHROMYCIN 250 MG PO TABS: ORAL_TABLET | ORAL | Status: DC

## 2013-05-11 MED ORDER — PREDNISONE 10 MG PO TABS: ORAL_TABLET | ORAL | Status: DC

## 2013-05-11 MED ORDER — ALBUTEROL SULFATE HFA 108 (90 BASE) MCG/ACT IN AERS
2.0000 | INHALATION_SPRAY | Freq: Once | RESPIRATORY_TRACT | Status: AC
  Administered 2013-05-11: 2 via RESPIRATORY_TRACT
  Filled 2013-05-11: qty 6.7

## 2013-05-11 NOTE — ED Provider Notes (Signed)
CSN: [id number]     Arrival date & time 05/11/13  1451 History   First MD Initiated Contact with Patient 05/11/13 1612     Chief Complaint  Patient presents with  . Cough  . Nasal Congestion   (Consider location/radiation/quality/duration/timing/severity/associated sxs/prior Treatment) Patient is a 32 y.o. female presenting with cough. The history is provided by the patient.  Cough Cough characteristics:  Productive and hacking Sputum characteristics:  Yellow Severity:  Moderate Onset quality:  Gradual Duration:  3 weeks Timing:  Intermittent Progression:  Unchanged Chronicity:  New Smoker: yes   Context: upper respiratory infection   Relieved by:  Nothing Worsened by:  Nothing tried Ineffective treatments:  Cough suppressants Associated symptoms: myalgias, shortness of breath and wheezing   Associated symptoms: no chest pain, no chills, no ear pain, no fever, no headaches, no rash, no rhinorrhea, no sinus congestion and no sore throat   Shortness of breath:    Severity:  Mild   Onset quality:  Gradual   Duration:  3 weeks   Timing:  Intermittent   Progression:  Worsening Wheezing:    Severity:  Mild   Onset quality:  Gradual   Timing:  Intermittent   Progression:  Unchanged   Chronicity:  New   Past Medical History  Diagnosis Date  . Dysrhythmia   . Gestational diabetes   . Anxiety   . Bacterial vaginosis    Past Surgical History  Procedure Laterality Date  . Induced abortion    . Cesarean section    . Tonsillectomy    . Tubal ligation     No family history on file. History  Substance Use Topics  . Smoking status: Current Every Day Smoker -- 1.00 packs/day    Types: Cigarettes  . Smokeless tobacco: Not on file  . Alcohol Use: No   OB History   Grav Para Term Preterm Abortions TAB SAB Ect Mult Living   3 2 2  1 1    2      Review of Systems  Constitutional: Negative for fever, chills, activity change and appetite change.  HENT: Positive for  congestion. Negative for ear pain, facial swelling, rhinorrhea, sore throat and trouble swallowing.   Eyes: Negative for visual disturbance.  Respiratory: Positive for cough, chest tightness, shortness of breath and wheezing. Negative for stridor.   Cardiovascular: Negative for chest pain.  Gastrointestinal: Negative for nausea, vomiting and abdominal pain.  Genitourinary: Negative for dysuria and flank pain.  Musculoskeletal: Positive for myalgias. Negative for neck pain and neck stiffness.  Skin: Negative.  Negative for rash.  Neurological: Negative for dizziness, syncope, weakness, numbness and headaches.  Hematological: Negative for adenopathy.  Psychiatric/Behavioral: Negative for confusion.  All other systems reviewed and are negative.    Allergies  Review of patient's allergies indicates no known allergies.  Home Medications   Current Outpatient Rx  Name  Route  Sig  Dispense  Refill  . albuterol (PROVENTIL HFA;VENTOLIN HFA) 108 (90 BASE) MCG/ACT inhaler   Inhalation   Inhale 2 puffs into the lungs every 6 (six) hours as needed. Shortness of breath         . amoxicillin (AMOXIL) 500 MG capsule   Oral   Take 1 capsule (500 mg total) by mouth 3 (three) times daily.   30 capsule   0   . ibuprofen (ADVIL,MOTRIN) 800 MG tablet   Oral   Take 1 tablet (800 mg total) by mouth 3 (three) times daily.   21 tablet  0   . naproxen sodium (ANAPROX) 220 MG tablet   Oral   Take 220 mg by mouth 2 (two) times daily with a meal.         . oxyCODONE-acetaminophen (PERCOCET/ROXICET) 5-325 MG per tablet      1 to 2 tabs PO q6hrs  PRN for pain   15 tablet   0    BP 151/99  Pulse 98  Temp(Src) 98.3 F (36.8 C) (Oral)  Resp 18  Ht 5\' 7"  (1.702 m)  Wt 240 lb (108.863 kg)  BMI 37.58 kg/m2  SpO2 94%  LMP 04/15/2013 Physical Exam  Nursing note and vitals reviewed. Constitutional: She is oriented to person, place, and time. She appears well-developed and well-nourished. No  distress.  HENT:  Head: Normocephalic and atraumatic.  Right Ear: Tympanic membrane and ear canal normal.  Left Ear: Tympanic membrane and ear canal normal.  Mouth/Throat: Uvula is midline, oropharynx is clear and moist and mucous membranes are normal. No oropharyngeal exudate.  Eyes: EOM are normal. Pupils are equal, round, and reactive to light.  Neck: Normal range of motion, full passive range of motion without pain and phonation normal. Neck supple.  Cardiovascular: Normal rate, regular rhythm, normal heart sounds and intact distal pulses.   No murmur heard. Pulmonary/Chest: Effort normal. No stridor. No respiratory distress. She has wheezes. She has no rales. She exhibits no tenderness.  Coarse lungs sounds bilaterally with expiratory wheezes through out.  No rales   Musculoskeletal: Normal range of motion. She exhibits no edema.  Lymphadenopathy:    She has no cervical adenopathy.  Neurological: She is alert and oriented to person, place, and time. She exhibits normal muscle tone. Coordination normal.  Skin: Skin is warm and dry.    ED Course  Procedures (including critical care time) Labs Review Labs Reviewed  GLUCOSE, CAPILLARY - Abnormal; Notable for the following:    Glucose-Capillary 244 (*)    All other components within normal limits   Imaging Review No results found.  EKG Interpretation   None       MDM   Scattered expiratory wheezes throughout.  No tachycardia or tachypnea.  No rales or fever to suggest need for imaging.  Albuterol and spacer dispensed and initial dose given here. Lung sounds improved after albuterol.  NAD.  Patient reports feeling better.      Agrees to zithromax, prednisone and close f/u with her PMD or return if needed.  Appears stable for d/c   Kamerin Grumbine L. Ariatna Jester, PA-C 05/12/13 0015

## 2013-05-11 NOTE — ED Notes (Signed)
Pt reports being sick w/ cough and nasal congestion for 3 weeks, thinks she may have bronchitis and would like an antibiotic.

## 2013-05-13 NOTE — ED Provider Notes (Signed)
Medical screening examination/treatment/procedure(s) were performed by non-physician practitioner and as supervising physician I was immediately available for consultation/collaboration.  EKG Interpretation   None        Juliet Rude. Rubin Payor, MD 05/13/13 0000

## 2013-07-10 ENCOUNTER — Encounter (HOSPITAL_COMMUNITY): Payer: Self-pay | Admitting: Emergency Medicine

## 2013-07-10 ENCOUNTER — Emergency Department (HOSPITAL_COMMUNITY): Payer: Self-pay

## 2013-07-10 ENCOUNTER — Emergency Department (HOSPITAL_COMMUNITY)
Admission: EM | Admit: 2013-07-10 | Discharge: 2013-07-10 | Disposition: A | Discharge status: Home / Self Care | Payer: Self-pay | Attending: Emergency Medicine | Admitting: Emergency Medicine

## 2013-07-10 DIAGNOSIS — Z8742 Personal history of other diseases of the female genital tract: Secondary | ICD-10-CM | POA: Insufficient documentation

## 2013-07-10 DIAGNOSIS — Z8632 Personal history of gestational diabetes: Secondary | ICD-10-CM | POA: Insufficient documentation

## 2013-07-10 DIAGNOSIS — F411 Generalized anxiety disorder: Secondary | ICD-10-CM | POA: Insufficient documentation

## 2013-07-10 DIAGNOSIS — Z9089 Acquired absence of other organs: Secondary | ICD-10-CM | POA: Insufficient documentation

## 2013-07-10 DIAGNOSIS — Z8679 Personal history of other diseases of the circulatory system: Secondary | ICD-10-CM | POA: Insufficient documentation

## 2013-07-10 DIAGNOSIS — F17200 Nicotine dependence, unspecified, uncomplicated: Secondary | ICD-10-CM | POA: Insufficient documentation

## 2013-07-10 DIAGNOSIS — J40 Bronchitis, not specified as acute or chronic: Secondary | ICD-10-CM | POA: Insufficient documentation

## 2013-07-10 MED ORDER — PREDNISONE 50 MG PO TABS
60.0000 mg | ORAL_TABLET | Freq: Once | ORAL | Status: AC
  Administered 2013-07-10: 60 mg via ORAL
  Filled 2013-07-10 (×2): qty 1

## 2013-07-10 MED ORDER — PREDNISONE 10 MG PO TABS: ORAL_TABLET | ORAL | Status: DC

## 2013-07-10 MED ORDER — ALBUTEROL SULFATE HFA 108 (90 BASE) MCG/ACT IN AERS
2.0000 | INHALATION_SPRAY | RESPIRATORY_TRACT | Status: DC | PRN
  Administered 2013-07-10: 2 via RESPIRATORY_TRACT
  Filled 2013-07-10: qty 6.7

## 2013-07-10 NOTE — ED Provider Notes (Signed)
Medical screening examination/treatment/procedure(s) were performed by non-physician practitioner and as supervising physician I was immediately available for consultation/collaboration.  EKG Interpretation   None         Mervin Kung, MD 07/10/13 1537

## 2013-07-10 NOTE — Discharge Instructions (Signed)
Your chest x-ray is negative for pneumonia or acute changes. There does appear to be some bronchitis changes present. Please use albuterol 2 puffs every 4 hours. Please use prednisone taper as directed. Please stop smoking. Bronchitis Bronchitis is swelling (inflammation) of the air tubes leading to your lungs (bronchi). This causes mucus and a cough. If the swelling gets bad, you may have trouble breathing. HOME CARE   Rest.  Drink enough fluids to keep your pee (urine) clear or pale yellow (unless you have a condition where you have to watch how much you drink).  Only take medicine as told by your doctor. If you were given antibiotic medicines, finish them even if you start to feel better.  Avoid smoke, irritating chemicals, and strong smells. These make the problem worse. Quit smoking if you smoke. This helps your lungs heal faster.  Use a cool mist humidifier. Change the water in the humidifier every day. You can also sit in the bathroom with hot shower running for 5 10 minutes. Keep the door closed.  See your health care provider as told.  Wash your hands often. GET HELP IF: Your problems do not get better after 1 week. GET HELP RIGHT AWAY IF:   Your fever gets worse.  You have chills.  Your chest hurts.  Your problems breathing get worse.  You have blood in your mucus.  You pass out (faint).  You feel lightheaded.  You have a bad headache.  You throw up (vomit) again and again. MAKE SURE YOU:  Understand these instructions.  Will watch your condition.  Will get help right away if you are not doing well or get worse. Document Released: 11/16/2007 Document Revised: 03/20/2013 Document Reviewed: 01/22/2013 [facility] Patient Information 2014 [place], [place].

## 2013-07-10 NOTE — ED Notes (Signed)
Pt has aerochamber at home,has used inh before. Inhaler given to pt, RT informed.

## 2013-07-10 NOTE — ED Notes (Signed)
Pt c/o cough, SOB and intermittent fever. Was seenin Dec for bronchitis. Increased SOB with prone position.

## 2013-07-10 NOTE — ED Provider Notes (Signed)
CSN: [id number]     Arrival date & time 07/10/13  1051 History   First MD Initiated Contact with Patient 07/10/13 1244     Chief Complaint  Patient presents with  . Bronchitis   (Consider location/radiation/quality/duration/timing/severity/associated sxs/prior Treatment) HPI Comments: Patient is a 33 year old female who presents to the emergency department with complaint of cough and shortness of breath. The patient states that for approximately a week and a half to 2 weeks she's been noticing increase in cough. She notices that when she has to walk especially in cold air she has increasing shortness of breath. The patient also complains of wheezing at times particularly at night. She's noted some mild nasal congestion, but states this is getting better. The patient states she has had problems with chills, and thinks that she may have been having some temperature elevations, but has not measured an actual temperature. His been no hemoptysis. Patient has not had any previous operations or procedures involving the lungs were chest. It is of note that the patient is a smoker. She has tried over-the-counter medications for assistance with her cough, but these have not been successful.  The history is provided by the patient.    Past Medical History  Diagnosis Date  . Dysrhythmia   . Gestational diabetes   . Anxiety   . Bacterial vaginosis    Past Surgical History  Procedure Laterality Date  . Induced abortion    . Cesarean section    . Tonsillectomy    . Tubal ligation     Family History  Problem Relation Age of Onset  . Diabetes Mother   . Hypertension Mother   . Diabetes Other   . Hypertension Other    History  Substance Use Topics  . Smoking status: Current Every Day Smoker -- 0.50 packs/day    Types: Cigarettes  . Smokeless tobacco: Never Used  . Alcohol Use: No   OB History   Grav Para Term Preterm Abortions TAB SAB Ect Mult Living   3 2 2  1 1    2      Review of  Systems  Constitutional: Positive for chills. Negative for activity change.       All ROS Neg except as noted in HPI  HENT: Negative for nosebleeds.   Eyes: Negative for photophobia and discharge.  Respiratory: Positive for cough and shortness of breath. Negative for wheezing.   Cardiovascular: Negative for chest pain and palpitations.  Gastrointestinal: Negative for abdominal pain and blood in stool.  Genitourinary: Negative for dysuria, frequency and hematuria.  Musculoskeletal: Negative for arthralgias, back pain and neck pain.  Skin: Negative.   Neurological: Negative for dizziness, seizures and speech difficulty.  Psychiatric/Behavioral: Negative for hallucinations and confusion. The patient is nervous/anxious.     Allergies  Review of patient's allergies indicates no known allergies.  Home Medications   Current Outpatient Rx  Name  Route  Sig  Dispense  Refill  . predniSONE (DELTASONE) 10 MG tablet      5,4,3,2,1 - take with food   15 tablet   0    BP 144/87  Pulse 86  Temp(Src) 98.2 F (36.8 C) (Oral)  Resp 16  Ht 5\' 7"  (1.702 m)  Wt 245 lb (111.131 kg)  BMI 38.36 kg/m2  SpO2 96%  LMP 06/19/2013 Physical Exam  Nursing note and vitals reviewed. Constitutional: She is oriented to person, place, and time. She appears well-developed and well-nourished.  Non-toxic appearance.  HENT:  Head: Normocephalic.  Right  Ear: Tympanic membrane and external ear normal.  Left Ear: Tympanic membrane and external ear normal.  Eyes: EOM and lids are normal. Pupils are equal, round, and reactive to light.  Neck: Normal range of motion. Neck supple. Carotid bruit is not present.  Cardiovascular: Normal rate, regular rhythm, normal heart sounds, intact distal pulses and normal pulses.   Pulmonary/Chest: No respiratory distress. She has wheezes. She has rhonchi.  There is symmetrical rise and fall of the chest. Patient speaks in complete sentences.  Abdominal: Soft. Bowel sounds are  normal. There is no tenderness. There is no guarding.  Musculoskeletal: Normal range of motion.  Lymphadenopathy:       Head (right side): No submandibular adenopathy present.       Head (left side): No submandibular adenopathy present.    She has no cervical adenopathy.  Neurological: She is alert and oriented to person, place, and time. She has normal strength. No cranial nerve deficit or sensory deficit.  Skin: Skin is warm and dry.  Psychiatric: She has a normal mood and affect. Her speech is normal.    ED Course  Procedures (including critical care time) Labs Review Labs Reviewed - No data to display Imaging Review Dg Chest 2 View  07/10/2013   CLINICAL DATA:  Cough and wheezing.  EXAM: CHEST  2 VIEW  COMPARISON:  Chest x-ray 08/19/2010.  FINDINGS: Lung volumes are normal. No consolidative airspace disease. No pleural effusions. No pneumothorax. No pulmonary nodule or mass noted. Pulmonary vasculature and the cardiomediastinal silhouette are within normal limits.  IMPRESSION: 1.  No radiographic evidence of acute cardiopulmonary disease.   Electronically Signed   By: Vinnie Langton M.D.   On: 07/10/2013 11:58    EKG Interpretation   None       MDM   1. Bronchitis    **I have reviewed nursing notes, vital signs, and all appropriate lab and imaging results for this patient.* Chest x-ray shows no radiographic evidence of acute cardiopulmonary disease. The pulse oximetry is 96% on room air. Within normal limits by my interpretation. Patient is able to speak in complete sentences. She does not appear to be in distress at this time.  The plan at this time is for the patient be treated with prednisone taper. She is also given an albuterol inhaler. She is strongly encouraged to stop smoking. She's encouraged to see her primary physician for additional evaluation and management. She is to return to the emergency department if any acute changes or problems.   Lenox Ahr,  PA-C 07/10/13 1345

## 2013-09-20 ENCOUNTER — Emergency Department (HOSPITAL_COMMUNITY)
Admission: EM | Admit: 2013-09-20 | Discharge: 2013-09-20 | Disposition: A | Discharge status: Home / Self Care | Payer: Self-pay | Attending: Emergency Medicine | Admitting: Emergency Medicine

## 2013-09-20 ENCOUNTER — Encounter (HOSPITAL_COMMUNITY): Payer: Self-pay | Admitting: Emergency Medicine

## 2013-09-20 ENCOUNTER — Emergency Department (HOSPITAL_COMMUNITY): Payer: Self-pay

## 2013-09-20 DIAGNOSIS — Z8742 Personal history of other diseases of the female genital tract: Secondary | ICD-10-CM | POA: Insufficient documentation

## 2013-09-20 DIAGNOSIS — F17200 Nicotine dependence, unspecified, uncomplicated: Secondary | ICD-10-CM | POA: Insufficient documentation

## 2013-09-20 DIAGNOSIS — Z8632 Personal history of gestational diabetes: Secondary | ICD-10-CM | POA: Insufficient documentation

## 2013-09-20 DIAGNOSIS — Z8659 Personal history of other mental and behavioral disorders: Secondary | ICD-10-CM | POA: Insufficient documentation

## 2013-09-20 DIAGNOSIS — Z8679 Personal history of other diseases of the circulatory system: Secondary | ICD-10-CM | POA: Insufficient documentation

## 2013-09-20 DIAGNOSIS — M7730 Calcaneal spur, unspecified foot: Secondary | ICD-10-CM | POA: Insufficient documentation

## 2013-09-20 DIAGNOSIS — M2550 Pain in unspecified joint: Secondary | ICD-10-CM | POA: Insufficient documentation

## 2013-09-20 MED ORDER — NAPROXEN 500 MG PO TABS: 500.0000 mg | ORAL_TABLET | Freq: Two times a day (BID) | ORAL | Status: DC

## 2013-09-20 MED ORDER — HYDROCODONE-ACETAMINOPHEN 5-325 MG PO TABS: ORAL_TABLET | ORAL | Status: DC

## 2013-09-20 NOTE — ED Provider Notes (Signed)
CSN: [id number]     Arrival date & time 09/20/13  7829 History   First MD Initiated Contact with Patient 09/20/13 [phone]     Chief Complaint  Patient presents with  . Foot Pain     (Consider location/radiation/quality/duration/timing/severity/associated sxs/prior Treatment) Patient is a 33 y.o. female presenting with lower extremity pain. The history is provided by the patient.  Foot Pain This is a new problem. The problem occurs constantly. The problem has been unchanged. Associated symptoms include arthralgias. Pertinent negatives include no abdominal pain, chills, congestion, coughing, fever, headaches, joint swelling, nausea, neck pain, numbness, rash, sore throat, urinary symptoms or vomiting. The symptoms are aggravated by standing and walking. She has tried nothing for the symptoms. The treatment provided no relief.   Patient reports localized pain to the right heel for several days.  Pain wore with weight bearing, resolves at rest.  She has tried heel inserts w/o relief.     Past Medical History  Diagnosis Date  . Dysrhythmia   . Gestational diabetes   . Anxiety   . Bacterial vaginosis    Past Surgical History  Procedure Laterality Date  . Induced abortion    . Cesarean section    . Tonsillectomy    . Tubal ligation     Family History  Problem Relation Age of Onset  . Diabetes Mother   . Hypertension Mother   . Diabetes Other   . Hypertension Other    History  Substance Use Topics  . Smoking status: Current Every Day Smoker -- 0.50 packs/day    Types: Cigarettes  . Smokeless tobacco: Never Used  . Alcohol Use: No   OB History   Grav Para Term Preterm Abortions TAB SAB Ect Mult Living   3 2 2  1 1    2      Review of Systems  Constitutional: Negative for fever and chills.  HENT: Negative for congestion and sore throat.   Respiratory: Negative for cough.   Gastrointestinal: Negative for nausea, vomiting and abdominal pain.  Genitourinary: Negative for dysuria  and difficulty urinating.  Musculoskeletal: Positive for arthralgias. Negative for back pain, joint swelling and neck pain.  Skin: Negative for color change, rash and wound.  Neurological: Negative for numbness and headaches.  All other systems reviewed and are negative.     Allergies  Review of patient's allergies indicates no known allergies.  Home Medications   Current Outpatient Rx  Name  Route  Sig  Dispense  Refill  . predniSONE (DELTASONE) 10 MG tablet      5,4,3,2,1 - take with food   15 tablet   0    LMP 09/16/2013 Physical Exam  Nursing note and vitals reviewed. Constitutional: She is oriented to person, place, and time. She appears well-developed and well-nourished. No distress.  HENT:  Head: Normocephalic and atraumatic.  Cardiovascular: Normal rate, regular rhythm, normal heart sounds and intact distal pulses.   Pulmonary/Chest: Effort normal and breath sounds normal.  Musculoskeletal: She exhibits tenderness. She exhibits no edema.  Localized ttp of the plantar surface of the right heel.   ROM is preserved.  DP pulse is brisk,distal sensation intact.  No edema, erythema, abrasion, bruising or bony deformity.  No proximal tenderness.  Neurological: She is alert and oriented to person, place, and time. She exhibits normal muscle tone. Coordination normal.  Skin: Skin is warm and dry.    ED Course  Procedures (including critical care time) Labs Review Labs Reviewed - No data to  display Imaging Review Dg Foot Complete Right  09/20/2013   CLINICAL DATA:  Heel pain  EXAM: RIGHT FOOT COMPLETE - 3+ VIEW  COMPARISON:  None.  FINDINGS: There is a plantar calcaneal spur. No other abnormal finding in the foot. No sign of fracture or other focal lesion.  IMPRESSION: Plantar calcaneal spur.  Otherwise normal.   Electronically Signed   By: Nelson Chimes M.D.   On: 09/20/2013 09:25    EKG Interpretation None      MDM   Final diagnoses:  Calcaneal spur   Pain to the  heel likely related to spurring. No concerning sx's for infection, no hx of trauma.   Pt advised to use heel inserts, ice and symptomatic treatment with NSAID and vicodin.  Podiatry referral given.        Andrell Bergeson L. Abel Ra, PA-C 09/22/13 1420

## 2013-09-20 NOTE — ED Notes (Signed)
Right heel pain, difficulty walking

## 2013-09-20 NOTE — Discharge Instructions (Signed)
Heel Spur °A heel spur is a hook of bone that can form on the calcaneus (the heel bone and the largest bone of the foot). Heel spurs are often associated with plantar fasciitis and usually come in people who have had the problem for an extended period of time. The cause of the relationship is unknown. The pain associated with them is thought to be caused by an inflammation (soreness and redness) of the plantar fascia rather than the spur itself. The plantar fascia is a thick fibrous like tissue that runs from the calcaneus (heel bone) to the ball of the foot. This strong, tight tissue helps maintain the arch of your foot. It helps distribute the weight across your foot as you walk or run. Stresses placed on the plantar fascia can be tremendous. When it is inflamed normal activities become painful. Pain is worse in the morning after sleeping. After sleeping the plantar fascia is tight. The first movements stretch the fascia and this causes pain. As the tendon loosens, the pain usually gets better. It often returns with too much standing or walking.  °About 70% of patients with plantar fasciitis have a heel spur. About half of people without foot pain also have heel spurs. °DIAGNOSIS  °The diagnosis of a heel spur is made by X-ray. The X-ray shows a hook of bone protruding from the bottom of the calcaneus at the point where the plantar fascia is attached to the heel bone.  °TREATMENT °· It is necessary to find out what is causing the stretching of the plantar fascia. If the cause is over-pronation (flat feet), orthotics and proper foot ware may help. °· Stretching exercises, losing weight, wearing shoes that have a cushioned heel that absorbs shock, and elevating the heel with the use of a heel cradle, heel cup, or orthotics may all help. Heel cradles and heel cups provide extra comfort and cushion to the heel, and reduce the amount of shock to the sore area. °AVOIDING THE PAIN OF PLANTAR FASCIITIS AND HEEL  SPURS °· Consult a sports medicine professional before beginning a new exercise program. °· Walking programs offer a good workout. There is a lower chance of overuse injuries common to the runners. There is less impact and less jarring of the joints. °· Begin all new exercise programs slowly. If problems or pains develop, decrease the amount of time or distance until you are at a comfortable level. °· Wear good shoes and replace them regularly. °· Stretch your foot and the heel cords at the back of the ankle (Achilles tendons) both before and after exercise. °· Run or exercise on even surfaces that are not hard. For example, asphalt is better than pavement. °· Do not run barefoot on hard surfaces. °· If using a treadmill, vary the incline. °· Do not continue to workout if you have foot or joint problems. Seek professional help if they do not improve. °HOME CARE INSTRUCTIONS  °· Avoid activities that cause you pain until you recover. °· Use ice or cold packs to the problem or painful areas after working out. °· Only take over-the-counter or prescription medicines for pain, discomfort, or fever as directed by your caregiver. °· Soft shoe inserts or athletic shoes with air or gel sole cushions may be helpful. °· If problems continue or become more severe, consult a sports medicine caregiver. Cortisone is a potent anti-inflammatory medication that may be injected into the painful area. You can discuss this treatment with your caregiver. °MAKE SURE YOU:  °·   Understand these instructions.  Will watch your condition.  Will get help right away if you are not doing well or get worse. Document Released: 07/06/2005 Document Revised: 08/22/2011 Document Reviewed: 09/07/2005 [facility] Patient Information 2014 [place].

## 2013-09-22 NOTE — ED Provider Notes (Signed)
Medical screening examination/treatment/procedure(s) were performed by non-physician practitioner and as supervising physician I was immediately available for consultation/collaboration.   EKG Interpretation None        Maudry Diego, MD 09/22/13 [phone]

## 2013-09-23 LAB — CBG MONITORING, ED: Glucose-Capillary: 188 mg/dL — ABNORMAL HIGH (ref 70–99)

## 2013-10-18 ENCOUNTER — Encounter (HOSPITAL_COMMUNITY): Payer: Self-pay | Admitting: Emergency Medicine

## 2013-10-18 ENCOUNTER — Emergency Department (HOSPITAL_COMMUNITY)
Admission: EM | Admit: 2013-10-18 | Discharge: 2013-10-18 | Disposition: A | Discharge status: Home / Self Care | Payer: Self-pay | Attending: Emergency Medicine | Admitting: Emergency Medicine

## 2013-10-18 ENCOUNTER — Emergency Department (HOSPITAL_COMMUNITY): Payer: Self-pay

## 2013-10-18 DIAGNOSIS — Z79899 Other long term (current) drug therapy: Secondary | ICD-10-CM | POA: Insufficient documentation

## 2013-10-18 DIAGNOSIS — Z8632 Personal history of gestational diabetes: Secondary | ICD-10-CM | POA: Insufficient documentation

## 2013-10-18 DIAGNOSIS — M79609 Pain in unspecified limb: Secondary | ICD-10-CM | POA: Insufficient documentation

## 2013-10-18 DIAGNOSIS — M79673 Pain in unspecified foot: Secondary | ICD-10-CM

## 2013-10-18 DIAGNOSIS — J40 Bronchitis, not specified as acute or chronic: Secondary | ICD-10-CM | POA: Insufficient documentation

## 2013-10-18 DIAGNOSIS — F17200 Nicotine dependence, unspecified, uncomplicated: Secondary | ICD-10-CM | POA: Insufficient documentation

## 2013-10-18 DIAGNOSIS — Z8742 Personal history of other diseases of the female genital tract: Secondary | ICD-10-CM | POA: Insufficient documentation

## 2013-10-18 DIAGNOSIS — Z8659 Personal history of other mental and behavioral disorders: Secondary | ICD-10-CM | POA: Insufficient documentation

## 2013-10-18 HISTORY — DX: Bronchitis, not specified as acute or chronic: J40

## 2013-10-18 MED ORDER — IPRATROPIUM BROMIDE 0.02 % IN SOLN
0.5000 mg | Freq: Once | RESPIRATORY_TRACT | Status: AC
  Administered 2013-10-18: 0.5 mg via RESPIRATORY_TRACT
  Filled 2013-10-18: qty 2.5

## 2013-10-18 MED ORDER — PREDNISONE 20 MG PO TABS: 40.0000 mg | ORAL_TABLET | Freq: Every day | ORAL | Status: DC

## 2013-10-18 MED ORDER — HYDROCODONE-ACETAMINOPHEN 5-325 MG PO TABS: 1.0000 | ORAL_TABLET | Freq: Four times a day (QID) | ORAL | Status: DC | PRN

## 2013-10-18 MED ORDER — ALBUTEROL SULFATE HFA 108 (90 BASE) MCG/ACT IN AERS
2.0000 | INHALATION_SPRAY | RESPIRATORY_TRACT | Status: DC | PRN
  Administered 2013-10-18: 2 via RESPIRATORY_TRACT
  Filled 2013-10-18: qty 6.7

## 2013-10-18 MED ORDER — ALBUTEROL SULFATE (2.5 MG/3ML) 0.083% IN NEBU
5.0000 mg | INHALATION_SOLUTION | Freq: Once | RESPIRATORY_TRACT | Status: AC
  Administered 2013-10-18: 5 mg via RESPIRATORY_TRACT
  Filled 2013-10-18: qty 6

## 2013-10-18 NOTE — ED Provider Notes (Signed)
Medical screening examination/treatment/procedure(s) were performed by non-physician practitioner and as supervising physician I was immediately available for consultation/collaboration.   EKG Interpretation None        Alfonzo Feller, DO 10/18/13 2117

## 2013-10-18 NOTE — Discharge Instructions (Signed)

## 2013-10-18 NOTE — ED Provider Notes (Signed)
CSN: [id number]     Arrival date & time 10/18/13  0820 History   First MD Initiated Contact with Patient 10/18/13 [phone]     Chief Complaint  Patient presents with  . Shortness of Breath  . Foot Pain     (Consider location/radiation/quality/duration/timing/severity/associated sxs/prior Treatment) HPI Comments: Pt c/o sob and cough for the last couple of days. Denies fever. Is a smoker has been using someone else inhaler without much relief. Has history of bronchitis and this feels similar. Pt states that she is also hurting at the top of her left foot. No injury, denies swelling or redness to the area  The history is provided by the patient. No language interpreter was used.    Past Medical History  Diagnosis Date  . Dysrhythmia   . Gestational diabetes   . Anxiety   . Bacterial vaginosis   . Bronchitis    Past Surgical History  Procedure Laterality Date  . Induced abortion    . Cesarean section    . Tonsillectomy    . Tubal ligation     Family History  Problem Relation Age of Onset  . Diabetes Mother   . Hypertension Mother   . Diabetes Other   . Hypertension Other    History  Substance Use Topics  . Smoking status: Current Every Day Smoker -- 0.50 packs/day    Types: Cigarettes  . Smokeless tobacco: Never Used  . Alcohol Use: No   OB History   Grav Para Term Preterm Abortions TAB SAB Ect Mult Living   3 2 2  1 1    2      Review of Systems  Constitutional: Negative.   Respiratory: Positive for cough and shortness of breath.   Cardiovascular: Negative.  Negative for chest pain.      Allergies  Review of patient's allergies indicates no known allergies.  Home Medications   Prior to Admission medications   Medication Sig Start Date End Date Taking? Authorizing Provider  HYDROcodone-acetaminophen (NORCO/VICODIN) 5-325 MG per tablet Take one-two tabs po q 4-6 hrs prn pain 09/20/13   Tammy L. Triplett, PA-C  naproxen (NAPROSYN) 500 MG tablet Take 1 tablet (500  mg total) by mouth 2 (two) times daily. Take with food 09/20/13   Tammy L. Triplett, PA-C  predniSONE (DELTASONE) 10 MG tablet 5,4,3,2,1 - take with food 07/10/13   Lenox Ahr, PA-C   BP 142/87  Pulse 90  Temp(Src) 98.8 F (37.1 C) (Oral)  Resp 17  Ht 5\' 7"  (1.702 m)  Wt 220 lb (99.791 kg)  BMI 34.45 kg/m2  SpO2 95%  LMP 10/14/2013 Physical Exam  Nursing note and vitals reviewed. Constitutional: She is oriented to person, place, and time. She appears well-developed and well-nourished.  Cardiovascular: Normal rate and regular rhythm.   Pulmonary/Chest: Effort normal. No respiratory distress. She has wheezes.  Musculoskeletal: Normal range of motion.  No swelling or warmth to the left foot. No deformity, pt has full rom  Neurological: She is alert and oriented to person, place, and time.  Skin: Skin is warm and dry.    ED Course  Procedures (including critical care time) Labs Review Labs Reviewed - No data to display  Imaging Review Dg Chest 2 View  10/18/2013   CLINICAL DATA:  Chief complaint of shortness of Breath  EXAM: CHEST  2 VIEW  COMPARISON:  07/10/2013  FINDINGS: The heart size and mediastinal contours are within normal limits. Both lungs are clear. The visualized skeletal structures  are unremarkable.  IMPRESSION: No active cardiopulmonary disease.   Electronically Signed   By: Kerby Moors M.D.   On: 10/18/2013 09:15     EKG Interpretation None      MDM   Final diagnoses:  Bronchitis  Foot pain    Pt doing better after treatment. Will send home on steroids and inhaler for a couple of days. Pt is requesting more pain medication for the different pains she has in her feet. Pt states that she takes one per day. Pt was given 20 about 1 month ago. Will give a couple more. She was told to follow up with ortho as we would not continue to do this    Glendell Docker, NP 10/18/13 [phone]

## 2013-10-18 NOTE — ED Notes (Signed)
Patient presents today with a chief complaint of shortness of breath, non-productive cough, and congestion. Patient has a history of bronchitis and reports this feels like a flare up. Patient also reporting right heel pain and left dorsal foot pain. Denies nausea, vomiting, fevers.

## 2013-10-23 ENCOUNTER — Emergency Department (HOSPITAL_COMMUNITY)
Admission: EM | Admit: 2013-10-23 | Discharge: 2013-10-23 | Disposition: A | Discharge status: Home / Self Care | Payer: Self-pay | Attending: Emergency Medicine | Admitting: Emergency Medicine

## 2013-10-23 ENCOUNTER — Encounter (HOSPITAL_COMMUNITY): Payer: Self-pay | Admitting: Emergency Medicine

## 2013-10-23 ENCOUNTER — Emergency Department (HOSPITAL_COMMUNITY): Payer: Self-pay

## 2013-10-23 DIAGNOSIS — Z8679 Personal history of other diseases of the circulatory system: Secondary | ICD-10-CM | POA: Insufficient documentation

## 2013-10-23 DIAGNOSIS — Z8632 Personal history of gestational diabetes: Secondary | ICD-10-CM | POA: Insufficient documentation

## 2013-10-23 DIAGNOSIS — Z8619 Personal history of other infectious and parasitic diseases: Secondary | ICD-10-CM | POA: Insufficient documentation

## 2013-10-23 DIAGNOSIS — Z8742 Personal history of other diseases of the female genital tract: Secondary | ICD-10-CM | POA: Insufficient documentation

## 2013-10-23 DIAGNOSIS — IMO0002 Reserved for concepts with insufficient information to code with codable children: Secondary | ICD-10-CM | POA: Insufficient documentation

## 2013-10-23 DIAGNOSIS — J069 Acute upper respiratory infection, unspecified: Secondary | ICD-10-CM | POA: Insufficient documentation

## 2013-10-23 DIAGNOSIS — Z791 Long term (current) use of non-steroidal anti-inflammatories (NSAID): Secondary | ICD-10-CM | POA: Insufficient documentation

## 2013-10-23 DIAGNOSIS — Z8659 Personal history of other mental and behavioral disorders: Secondary | ICD-10-CM | POA: Insufficient documentation

## 2013-10-23 DIAGNOSIS — F17200 Nicotine dependence, unspecified, uncomplicated: Secondary | ICD-10-CM | POA: Insufficient documentation

## 2013-10-23 LAB — RAPID STREP SCREEN (MED CTR MEBANE ONLY): Streptococcus, Group A Screen (Direct): NEGATIVE

## 2013-10-23 MED ORDER — IPRATROPIUM-ALBUTEROL 0.5-2.5 (3) MG/3ML IN SOLN
3.0000 mL | Freq: Once | RESPIRATORY_TRACT | Status: AC
  Administered 2013-10-23: 3 mL via RESPIRATORY_TRACT
  Filled 2013-10-23: qty 3

## 2013-10-23 MED ORDER — BENZONATATE 100 MG PO CAPS: 100.0000 mg | ORAL_CAPSULE | Freq: Three times a day (TID) | ORAL | Status: DC | PRN

## 2013-10-23 MED ORDER — ALBUTEROL SULFATE (2.5 MG/3ML) 0.083% IN NEBU
2.5000 mg | INHALATION_SOLUTION | Freq: Once | RESPIRATORY_TRACT | Status: AC
  Administered 2013-10-23: 2.5 mg via RESPIRATORY_TRACT
  Filled 2013-10-23: qty 3

## 2013-10-23 MED ORDER — ALBUTEROL SULFATE HFA 108 (90 BASE) MCG/ACT IN AERS: 2.0000 | INHALATION_SPRAY | RESPIRATORY_TRACT | Status: DC | PRN

## 2013-10-23 NOTE — Discharge Instructions (Signed)
°Emergency Department Resource Guide °1) Find a Doctor and Pay Out of Pocket °Although you won't have to find out who is covered by your insurance plan, it is a good idea to ask around and get recommendations. You will then need to call the office and see if the doctor you have chosen will accept you as a new patient and what types of options they offer for patients who are self-pay. Some doctors offer discounts or will set up payment plans for their patients who do not have insurance, but you will need to ask so you aren't surprised when you get to your appointment. ° °2) Contact Your Local Health Department °Not all health departments have doctors that can see patients for sick visits, but many do, so it is worth a call to see if yours does. If you don't know where your local health department is, you can check in your phone book. The CDC also has a tool to help you locate your state's health department, and many state websites also have listings of all of their local health departments. ° °3) Find a Walk-in Clinic °If your illness is not likely to be very severe or complicated, you may want to try a walk in clinic. These are popping up all over the country in pharmacies, drugstores, and shopping centers. They're usually staffed by nurse practitioners or physician assistants that have been trained to treat common illnesses and complaints. They're usually fairly quick and inexpensive. However, if you have serious medical issues or chronic medical problems, these are probably not your best option. ° °No Primary Care Doctor: °- Call [facility] at  832-8000 - they can help you locate a primary care doctor that  accepts your insurance, provides certain services, etc. °- Physician Referral Service- 1-800-533-3463 ° °Chronic Pain Problems: °Organization         Address  Phone   Notes  °[facility]  (336) 297-2271 Patients need to be referred by their primary care doctor.  ° °Medication  Assistance: °Organization         Address  Phone   Notes  °[place] [facility] [address] °[place], [place] [address] (336) 641-8030 --Must be a resident of [place] °-- Must have NO insurance coverage whatsoever (no Medicaid/ Medicare, etc.) °-- The pt. MUST have a primary care doctor that directs their care regularly and follows them in the community °  °[facility]  (866) 331-1348   °[employer]  (888) 892-1162   ° °Agencies that provide inexpensive medical care: °Organization         Address  Phone   Notes  °[facility]  (336) 832-8035   °[facility]    (336) 832-7272   °[facility] [address] °[place], [place] [address] (336) 832-4777   °[facility] [address], °[place] (336) 271-4999   °[facility]    (336) 373-0678   °[facility]    (336) 272-1050   °[facility] ° [address], [place] Phone:  (336) 832-4444, Fax:  (336) 832-4440 Hours of Operation:  9 am - 6 pm, M-F.  Also accepts Medicaid/Medicare and self-pay.  °[facility] ° [address],  Phone: (336) 832-3150, Fax: (336) 832-3151. Hours of Operation:  8:30 am - 5:30 pm, M-F.  Also accepts Medicaid and self-pay.  °[facility] [place] [address], [place] Phone: (336) 878-6027   °[facility] [address], [place], [place] (336)723-1848, Ext. 123 Mondays & Thursdays: 7-9 AM.  First 15 patients are seen on a first come, first serve basis. °  ° °Medicaid-accepting [place] Providers: ° °Organization         Address  Phone   Notes  °[facility] [address], [place] (336) 641-2100 Also accepts self-pay patients.  °[facility] [address], [place] ° (336) 856-9996   °[facility] [address], [place]  (336) 288-8857   °[facility] [address] [place] [address], [place] (336) 299-7000   °Veita Bland [address], [place]  ° (336) 373-1557 Only accepts [place] Access Medicaid patients after they have their name applied to their card.  ° °Self-Pay (no insurance) in [place]: ° °Organization         Address  Phone   Notes  °Sickle Cell Patients, [facility] [address], [place] (336) 832-1970   °[facility] [address], [place] (336) 832-4400   °[facility] ° [address], [place] (336) 992-4800   °[facility]/Dr. Osei-Bonsu ° [address] [place] [address], [place] or [address], [place] (336) [phone] Phone number for both [place] and [place] locations is the same.  °[facility] [address], [place] (336) 299-0000   °[facility] [address] [place] [address], [place] or [address] (336) 852-7530 °(336) 878-2260   °[facility] [address], [place] (336) 350-1642, phone; (336) 294-5005, fax Sees patients 1st and 3rd Saturday of every month.  Must not qualify for public or private insurance (i.e. Medicaid, Medicare, [place] Health Choice, Veterans' Benefits) • Household income should be no more than 200% of the poverty level •The clinic cannot treat you if you are pregnant or think you are pregnant • Sexually transmitted diseases are not treated at the clinic.  ° ° °Dental Care: °Organization         Address  Phone  Notes  °[place] [facility] [address], [place] (336) 641-6152 Accepts children up to age 21 who are enrolled in Medicaid or [place] Health Choice; pregnant women with a Medicaid card; and children who have applied for Medicaid or [place] Health Choice, but were declined, whose parents can pay a reduced fee at time of service.  °[place]  [facility] [place]  [address], [place] (336) 641-7733 Accepts children up to age 21 who are enrolled in Medicaid or [place] Health Choice; pregnant women with a Medicaid card; and children who have applied for Medicaid or [place] Health Choice, but were declined, whose parents can pay a reduced fee at time of service.  °[facility] ° [address], [place] (336) 641-4533 Patients are seen by appointment only. Walk-ins are not accepted. [facility] will see patients 18 years of age and older. °Monday - Tuesday (8am-5pm) °Most Wednesdays (8:30-5pm) °$30 per visit, cash only  °[facility] ° [address], [place] (336) 641-4533 Patients are seen by appointment only. Walk-ins are not accepted. [facility] will see patients 18 years of age and older. °One   Wednesday Evening (Monthly: Volunteer Based).  $30 per visit, cash only  °[facility]  (919) 537-3737 for adults; Children under age 4, call Graduate Pediatric Dentistry at ([phone]) [phone]. Children aged 4-14, please call (919) [phone] to request a pediatric application. ° Dental services are provided in all areas of dental care including fillings, crowns and bridges, complete and partial dentures, implants, gum treatment, root canals, and extractions. Preventive care is also provided. Treatment is provided to both adults and children. °Patients are selected via a lottery and there is often a waiting list. °  °[facility] [address], °[place] ° (336) 763-8833 [email or web address].drcivils.[email or web address] °  °[facility] [address], [place], [place] (336)723-1848, Ext. 123 Second and Fourth Thursday of each month, opens at 6:30 AM; Clinic ends at 9 AM.  Patients are seen on a first-come first-served basis, and a limited number are seen during each clinic.  ° °[facility] ° [address], [place], [place] (336) 723-7904    Eligibility Requirements °You must have lived in [place], [place], or [place] counties for at least the last three months. °  You cannot be eligible for state or federal sponsored healthcare insurance, including Veterans Administration, Medicaid, or Medicare. °  You generally cannot be eligible for healthcare insurance through your employer.  °  How to apply: °Eligibility screenings are held every Tuesday and Wednesday afternoon from 1:00 pm until 4:00 pm. You do not need an appointment for the interview!  °[facility] [address], [place], [place] 336-631-2330   °[facility]  336-342-8273   °[facility]  336-703-3100   °[facility]  336-570-6415   ° °Behavioral Health Resources in the Community: °Intensive Outpatient Programs °Organization         Address  Phone  Notes  °[facility] [address], [place], [place] 336-878-6098   °[facility] [address], [place], [place] 336-832-9800   °[facility] [address], [place], [place] ° 336-882-2125   °[facility] [address],  °[place], [place] 1-800-853-5163 or 336-641-4981   °Substance Abuse Resources °Organization         Address  Phone  Notes  °[facility]  336-882-2125   °[facility]  336-784-9470   °[facility]  336-285-9073   °[facility]  336-845-3988   °Residential & Outpatient Substance Abuse Program  1-800-659-3381   °Psychological Services °Organization         Address  Phone  Notes  °[facility]  336- 832-9600   °[facility]  336- 378-7881   °[facility] [address], [place] 1-800-853-5163 or 336-641-4981   ° °Mobile Crisis Teams °Organization         Address  Phone  Notes  °[facility], Mobile Crisis Care Unit  1-877-626-1772   °Assertive °[facility] ° [address]  [place],  336-834-9664   °Sharon DeEsch [address] °[place]  336-554-5454   ° °Self-Help/Support Groups °Organization         Address  Phone             Notes  °[facility] [place] - variety of support groups  336- 373-1402 Call for more information  °Narcotics Anonymous (NA), [facility] [address], °[place]   2 meetings at this location  ° °  Residential Treatment Programs Organization         Address  Phone  Notes  [facility] [address],    [place]  1-705-797-7645   [facility]  [address] [place] [address], [place], [place]   [place], [place] 925-667-7452 Admissions: 8am-3pm M-F  [facility] [place] [address]    [place], [place] 268-341-9622   [facility] [address] [place], [place], [place]   [facility] [address],  [place], [place]   [facility] - Intensive Outpatient [address], [place], [place]   [facility] [address]  [place], [place] 1-512-311-8062 or 8017549295   [facility] [address] [place], [place] Accepts Medicaid  [facility] [address],  [place] 1-947-608-8385 Substance Abuse/Addiction Treatment   [facility] Organization         Address  Phone  Notes  [facility]  785-213-0253   Domenic Schwab, PhD [address] [place], [place]   3045807531 or 714-467-0212   [place], [place] 734-679-5377   [facility] [address] [place], [place] 450-727-4375 Insurance/Medicaid/sponsorship through [facility] [address]                                    [place], [place] [phone] [place] [address] [place], [place] (616)[phone]    Dr. Adele Schilder  ([phone])[phone]   [facility] Dept. 1) [address] [place] 2) [place] 3)  [place] [address], [place] [phone]  [phone]   [place] [phone] or [phone] (After Hours)       Take the prescription as directed.  Use your albuterol inhaler (2 to 4 puffs) every 4 hours for the next 7 days, then as needed for cough, wheezing, or shortness of breath. Take over the counter decongestant (such as sudafed), as well as an antihistamine (such as claritin or zyrtec), as directed on packaging, for the next 2 weeks.  Use over the counter normal saline nasal spray, as instructed in the Emergency Department, several times per day for the next 2 weeks. Take over the counter tylenol and ibuprofen, as directed on packaging, as needed for discomfort.  Gargle with warm water several times per day to help with discomfort.  May also use over the counter sore throat pain medicines such as chloraseptic or sucrets, as directed on packaging, as needed for discomfort. Call your regular medical doctor this morning to schedule a follow up appointment within the next 2 days.  Return to the Emergency Department immediately sooner if worsening.

## 2013-10-23 NOTE — ED Notes (Signed)
nad noted prior to dc. Dc instructions reviewed and explained. 2 scripts given. Voiced understanding. Ambulated out without difficulty.

## 2013-10-23 NOTE — ED Notes (Signed)
Pt states she was here Friday and dx with bronchitis. States symptoms have worsened.

## 2013-10-23 NOTE — ED Provider Notes (Signed)
CSN: [id number]     Arrival date & time 10/23/13  0734 History   First MD Initiated Contact with Patient 10/23/13 0750     Chief Complaint  Patient presents with  . Cough      HPI Pt was seen at 0750.  Per pt, c/o gradual onset and persistence of constant sore throat, runny/stuffy nose, sinus congestion, and cough for the past 3 days. States she was recently evaluated for same last week, dx bronchitis, rx MDI and prednisone without significant improvement. States she finished prednisone rx yesterday. Pt continues to smoke cigarettes.  Denies fevers, no rash, no CP/SOB, no N/V/D, no abd pain.     Past Medical History  Diagnosis Date  . Dysrhythmia   . Gestational diabetes   . Anxiety   . Bacterial vaginosis   . Bronchitis    Past Surgical History  Procedure Laterality Date  . Induced abortion    . Cesarean section    . Tonsillectomy    . Tubal ligation     Family History  Problem Relation Age of Onset  . Diabetes Mother   . Hypertension Mother   . Diabetes Other   . Hypertension Other    History  Substance Use Topics  . Smoking status: Current Every Day Smoker -- 0.50 packs/day    Types: Cigarettes  . Smokeless tobacco: Never Used  . Alcohol Use: No   OB History   Grav Para Term Preterm Abortions TAB SAB Ect Mult Living   3 2 2  1 1    2      Review of Systems ROS: Statement: All systems negative except as marked or noted in the HPI; Constitutional: Negative for fever and chills. ; ; Eyes: Negative for eye pain, redness and discharge. ; ; ENMT: Negative for ear pain, hoarseness, +nasal congestion, rhinorrhea, sinus pressure and sore throat. ; ; Cardiovascular: Negative for chest pain, palpitations, diaphoresis, dyspnea and peripheral edema. ; ; Respiratory: +cough. Negative for wheezing and stridor. ; ; Gastrointestinal: Negative for nausea, vomiting, diarrhea, abdominal pain, blood in stool, hematemesis, jaundice and rectal bleeding. . ; ; Genitourinary: Negative for  dysuria, flank pain and hematuria. ; ; Musculoskeletal: Negative for back pain and neck pain. Negative for swelling and trauma.; ; Skin: Negative for pruritus, rash, abrasions, blisters, bruising and skin lesion.; ; Neuro: Negative for headache, lightheadedness and neck stiffness. Negative for weakness, altered level of consciousness , altered mental status, extremity weakness, paresthesias, involuntary movement, seizure and syncope.        Allergies  Review of patient's allergies indicates no known allergies.  Home Medications   Prior to Admission medications   Medication Sig Start Date End Date Taking? Authorizing Provider  HYDROcodone-acetaminophen (NORCO/VICODIN) 5-325 MG per tablet Take one-two tabs po q 4-6 hrs prn pain 09/20/13   Tammy L. Triplett, PA-C  HYDROcodone-acetaminophen (NORCO/VICODIN) 5-325 MG per tablet Take 1 tablet by mouth every 6 (six) hours as needed. 10/18/13   Glendell Docker, NP  naproxen (NAPROSYN) 500 MG tablet Take 1 tablet (500 mg total) by mouth 2 (two) times daily. Take with food 09/20/13   Tammy L. Triplett, PA-C  predniSONE (DELTASONE) 10 MG tablet 5,4,3,2,1 - take with food 07/10/13   Lenox Ahr, PA-C  predniSONE (DELTASONE) 20 MG tablet Take 2 tablets (40 mg total) by mouth daily. 10/18/13   Glendell Docker, NP   BP 135/100  Pulse 92  Temp(Src) 97.9 F (36.6 C) (Oral)  Resp 16  SpO2 96%  LMP  10/14/2013 Physical Exam 0755: Physical examination:  Nursing notes reviewed; Vital signs and O2 SAT reviewed;  Constitutional: Well developed, Well nourished, Well hydrated, In no acute distress; Head:  Normocephalic, atraumatic; Eyes: EOMI, PERRL, No scleral icterus; ENMT: TM's clear bilat. +edemetous nasal turbinates bilat with clear rhinorrhea. Mouth and pharynx without lesions. No tonsillar exudates. No intra-oral edema. No submandibular or sublingual edema. No hoarse voice, no drooling, no stridor. No pain with manipulation of larynx. No trismus. Mouth and  pharynx normal, Mucous membranes moist; Neck: Supple, Full range of motion, No lymphadenopathy; Cardiovascular: Regular rate and rhythm, No murmur, rub, or gallop; Respiratory: Breath sounds coarse & equal bilaterally, No wheezes.  Speaking full sentences with ease, Normal respiratory effort/excursion; Chest: Nontender, Movement normal; Abdomen: Soft, Nontender, Nondistended, Normal bowel sounds; Genitourinary: No CVA tenderness; Extremities: Pulses normal, No tenderness, No edema, No calf edema or asymmetry.; Neuro: AA&Ox3, Major CN grossly intact.  Speech clear. No gross focal motor or sensory deficits in extremities. Climbs on and off stretcher easily by herself. Gait steady.; Skin: Color normal, Warm, Dry.   ED Course  Procedures     EKG Interpretation None      MDM  MDM Reviewed: previous chart, nursing note and vitals Interpretation: x-ray and labs    Results for orders placed during the hospital encounter of 10/23/13  RAPID STREP SCREEN      Result Value Ref Range   Streptococcus, Group A Screen (Direct) NEGATIVE  NEGATIVE   Dg Chest 2 View 10/23/2013   CLINICAL DATA:  Chronic cough  EXAM: CHEST  2 VIEW  COMPARISON:  10/18/2013  FINDINGS: The heart size and mediastinal contours are within normal limits. Both lungs are clear. The visualized skeletal structures are unremarkable.  IMPRESSION: No active cardiopulmonary disease.   Electronically Signed   By: Franchot Gallo M.D.   On: 10/23/2013 08:22    0840:  Pt states she "feels better" after neb.  NAD, lungs CTA bilat, no wheezing, resps easy, speaking full sentences, Sats 96% R/A.  Pt ambulated around the ED with resps easy, NAD.  Wants to go home now. Will tx symptomatically at this time. Dx and testing d/w pt.  Questions answered.  Verb understanding, agreeable to d/c home with outpt f/u.      Alfonzo Feller, DO 10/25/13 2152

## 2013-10-23 NOTE — ED Notes (Signed)
Respiratory paged

## 2013-10-25 LAB — CULTURE, GROUP A STREP

## 2014-01-24 ENCOUNTER — Encounter (HOSPITAL_COMMUNITY): Payer: Self-pay | Admitting: Emergency Medicine

## 2014-01-24 ENCOUNTER — Emergency Department (HOSPITAL_COMMUNITY)
Admission: EM | Admit: 2014-01-24 | Discharge: 2014-01-24 | Disposition: A | Discharge status: Home / Self Care | Payer: BC Managed Care – PPO | Attending: Emergency Medicine | Admitting: Emergency Medicine

## 2014-01-24 DIAGNOSIS — Z8742 Personal history of other diseases of the female genital tract: Secondary | ICD-10-CM | POA: Insufficient documentation

## 2014-01-24 DIAGNOSIS — Z8709 Personal history of other diseases of the respiratory system: Secondary | ICD-10-CM | POA: Insufficient documentation

## 2014-01-24 DIAGNOSIS — Z8632 Personal history of gestational diabetes: Secondary | ICD-10-CM | POA: Insufficient documentation

## 2014-01-24 DIAGNOSIS — Z8619 Personal history of other infectious and parasitic diseases: Secondary | ICD-10-CM | POA: Insufficient documentation

## 2014-01-24 DIAGNOSIS — R21 Rash and other nonspecific skin eruption: Secondary | ICD-10-CM

## 2014-01-24 DIAGNOSIS — Z8679 Personal history of other diseases of the circulatory system: Secondary | ICD-10-CM | POA: Diagnosis not present

## 2014-01-24 DIAGNOSIS — F17200 Nicotine dependence, unspecified, uncomplicated: Secondary | ICD-10-CM | POA: Insufficient documentation

## 2014-01-24 DIAGNOSIS — K0889 Other specified disorders of teeth and supporting structures: Secondary | ICD-10-CM

## 2014-01-24 DIAGNOSIS — Z79899 Other long term (current) drug therapy: Secondary | ICD-10-CM | POA: Diagnosis not present

## 2014-01-24 DIAGNOSIS — F411 Generalized anxiety disorder: Secondary | ICD-10-CM | POA: Insufficient documentation

## 2014-01-24 DIAGNOSIS — K089 Disorder of teeth and supporting structures, unspecified: Secondary | ICD-10-CM | POA: Insufficient documentation

## 2014-01-24 MED ORDER — IBUPROFEN 800 MG PO TABS
800.0000 mg | ORAL_TABLET | Freq: Once | ORAL | Status: AC
  Administered 2014-01-24: 800 mg via ORAL
  Filled 2014-01-24: qty 1

## 2014-01-24 MED ORDER — ACETAMINOPHEN-CODEINE #3 300-30 MG PO TABS: 1.0000 | ORAL_TABLET | Freq: Four times a day (QID) | ORAL | Status: DC | PRN

## 2014-01-24 MED ORDER — PENICILLIN V POTASSIUM 250 MG PO TABS
500.0000 mg | ORAL_TABLET | Freq: Once | ORAL | Status: AC
  Administered 2014-01-24: 500 mg via ORAL
  Filled 2014-01-24: qty 2

## 2014-01-24 MED ORDER — AMOXICILLIN 500 MG PO CAPS: 500.0000 mg | ORAL_CAPSULE | Freq: Three times a day (TID) | ORAL | Status: DC

## 2014-01-24 MED ORDER — ACETAMINOPHEN 325 MG PO TABS
650.0000 mg | ORAL_TABLET | Freq: Once | ORAL | Status: AC
  Administered 2014-01-24: 650 mg via ORAL
  Filled 2014-01-24: qty 2

## 2014-01-24 NOTE — ED Provider Notes (Signed)
CSN: [id number]     Arrival date & time 01/24/14  0932 History   First MD Initiated Contact with Patient 01/24/14 [phone]     Chief Complaint  Patient presents with  . Dental Pain  . Rash     (Consider location/radiation/quality/duration/timing/severity/associated sxs/prior Treatment) HPI Comments: Patient is a 33 year old female who presents to the emergency department with a complaint of toothache and also a rash. The patient states that over the past week she is having increasing pain of the right face and right back teeth. Patient states she thinks that she has a wisdom tooth that may be coming through causing some of this problem. She has not had high fevers. She's not had any difficulty with speaking or swallowing.  Patient also complains of rash under the breasts and in between the legs. The patient states that she is diabetic, her sugars have been elevated recently, and she works in a very hot work environment. She request assistance with these problems.  Patient is a 33 y.o. female presenting with tooth pain and rash. The history is provided by the patient.  Dental Pain Severity:  Moderate Timing:  Intermittent Associated symptoms: no neck pain   Rash Associated symptoms: no abdominal pain, no joint pain, no shortness of breath and not wheezing     Past Medical History  Diagnosis Date  . Dysrhythmia   . Gestational diabetes   . Anxiety   . Bacterial vaginosis   . Bronchitis    Past Surgical History  Procedure Laterality Date  . Induced abortion    . Cesarean section    . Tonsillectomy    . Tubal ligation     Family History  Problem Relation Age of Onset  . Diabetes Mother   . Hypertension Mother   . Diabetes Other   . Hypertension Other    History  Substance Use Topics  . Smoking status: Current Every Day Smoker -- 0.50 packs/day    Types: Cigarettes  . Smokeless tobacco: Never Used  . Alcohol Use: No   OB History   Grav Para Term Preterm Abortions TAB SAB  Ect Mult Living   3 2 2  1 1    2      Review of Systems  Constitutional: Negative for activity change.       All ROS Neg except as noted in HPI  HENT: Positive for dental problem. Negative for nosebleeds.   Eyes: Negative for photophobia and discharge.  Respiratory: Negative for cough, shortness of breath and wheezing.   Cardiovascular: Negative for chest pain and palpitations.  Gastrointestinal: Negative for abdominal pain and blood in stool.  Genitourinary: Negative for dysuria, frequency and hematuria.  Musculoskeletal: Negative for arthralgias, back pain and neck pain.  Skin: Positive for rash.  Neurological: Negative for dizziness, seizures and speech difficulty.  Psychiatric/Behavioral: Negative for hallucinations and confusion. The patient is nervous/anxious.       Allergies  Milk-related compounds  Home Medications   Prior to Admission medications   Medication Sig Start Date End Date Taking? Authorizing Provider  acidophilus (RISAQUAD) CAPS capsule Take 1 capsule by mouth daily.   Yes Historical Provider, MD  ibuprofen (ADVIL,MOTRIN) 200 MG tablet Take 800 mg by mouth every 6 (six) hours as needed for moderate pain.   Yes Historical Provider, MD   BP 131/78  Pulse 82  Temp(Src) 98.2 F (36.8 C) (Oral)  Resp 18  SpO2 100%  LMP 01/10/2014 Physical Exam  Nursing note and vitals reviewed. Constitutional: She  is oriented to person, place, and time. She appears well-developed and well-nourished.  Non-toxic appearance.  HENT:  Head: Normocephalic.  Right Ear: Tympanic membrane and external ear normal.  Left Ear: Tympanic membrane and external ear normal.  There is a deep cavity of the left lower first molar. The third upper and lower molar appeared to be attempting to a rub to the skin. There is no visible abscess. Airway is patent. There is no swelling under the tongue. The speech is clear and understandable.  Eyes: EOM and lids are normal. Pupils are equal, round, and  reactive to light.  Neck: Normal range of motion. Neck supple. Carotid bruit is not present.  Cardiovascular: Normal rate, regular rhythm, normal heart sounds, intact distal pulses and normal pulses.   Pulmonary/Chest: Breath sounds normal. No respiratory distress.  Abdominal: Soft. Bowel sounds are normal. There is no tenderness. There is no guarding.  Musculoskeletal: Normal range of motion.  Lymphadenopathy:       Head (right side): No submandibular adenopathy present.       Head (left side): No submandibular adenopathy present.    She has no cervical adenopathy.  Neurological: She is alert and oriented to person, place, and time. She has normal strength. No cranial nerve deficit or sensory deficit.  Skin: Skin is warm and dry.   Patient has a red scaly rash with raised borders under the right and left breast. Also in the creases between the thigh and the pubis area.  Chaperone present during examination.  Psychiatric: She has a normal mood and affect. Her speech is normal.    ED Course  Procedures (including critical care time) Labs Review Labs Reviewed - No data to display  Imaging Review No results found.   EKG Interpretation None      MDM Patient has a deep cavity of the left lower first molar. She appears to have wisdom teeth from the upper and lower left jaw area prompting. No visible abscess. Patient will be treated with Amoxil 3 times daily. She is asked to continue the ibuprofen 600 mg with each meal and at bedtime. The patient is also given a prescription for Tylenol with Codeine to use if needed for pain. Patient strongly encouraged to see a dentist as soon as possible.  The patient has a rash that is consistent with fungal/yeast rash under the breasts and in between the legs. Patient advised to use over-the-counter antifungal cream and powder.    Final diagnoses:  None    *I have reviewed nursing notes, vital signs, and all appropriate lab and imaging results  for this patient.Lenox Ahr, PA-C 01/24/14 1029

## 2014-01-24 NOTE — ED Provider Notes (Signed)
Medical screening examination/treatment/procedure(s) were performed by non-physician practitioner and as supervising physician I was immediately available for consultation/collaboration.   EKG Interpretation None       Nat Christen, MD 01/24/14 1531

## 2014-01-24 NOTE — Discharge Instructions (Signed)
Please apply antifungal powder under the breasts and between the legs to the rash each morning. Please apply antifungal cream to the rash under the breasts and between the legs at bedtime.  Please use Amoxil 3 times daily with food until all taken. Use ibuprofen and/or Tylenol for mild pain. Use Tylenol codeine for more severe pain. It is very important that you see a dentist as soon as possible for evaluation and management of your dental changes. Dental Pain A tooth ache may be caused by cavities (tooth decay). Cavities expose the nerve of the tooth to air and hot or cold temperatures. It may come from an infection or abscess (also called a boil or furuncle) around your tooth. It is also often caused by dental caries (tooth decay). This causes the pain you are having. DIAGNOSIS  Your caregiver can diagnose this problem by exam. TREATMENT   If caused by an infection, it may be treated with medications which kill germs (antibiotics) and pain medications as prescribed by your caregiver. Take medications as directed.  Only take over-the-counter or prescription medicines for pain, discomfort, or fever as directed by your caregiver.  Whether the tooth ache today is caused by infection or dental disease, you should see your dentist as soon as possible for further care. SEEK MEDICAL CARE IF: The exam and treatment you received today has been provided on an emergency basis only. This is not a substitute for complete medical or dental care. If your problem worsens or new problems (symptoms) appear, and you are unable to meet with your dentist, call or return to this location. SEEK IMMEDIATE MEDICAL CARE IF:   You have a fever.  You develop redness and swelling of your face, jaw, or neck.  You are unable to open your mouth.  You have severe pain uncontrolled by pain medicine. MAKE SURE YOU:   Understand these instructions.  Will watch your condition.  Will get help right away if you are not  doing well or get worse. Document Released: 05/30/2005 Document Revised: 08/22/2011 Document Reviewed: 01/16/2008 [facility] Patient Information 2015 [place], [place]. This information is not intended to replace advice given to you by your health care provider. Make sure you discuss any questions you have with your health care provider.

## 2014-01-24 NOTE — ED Notes (Signed)
Left side tooth pain times one week.  Also has a  Rash under her breast.

## 2014-02-11 ENCOUNTER — Encounter (HOSPITAL_COMMUNITY): Payer: Self-pay | Admitting: Emergency Medicine

## 2014-02-11 ENCOUNTER — Emergency Department (HOSPITAL_COMMUNITY)
Admission: EM | Admit: 2014-02-11 | Discharge: 2014-02-11 | Disposition: A | Discharge status: Home / Self Care | Payer: BC Managed Care – PPO | Attending: Emergency Medicine | Admitting: Emergency Medicine

## 2014-02-11 DIAGNOSIS — M722 Plantar fascial fibromatosis: Secondary | ICD-10-CM

## 2014-02-11 DIAGNOSIS — Z791 Long term (current) use of non-steroidal anti-inflammatories (NSAID): Secondary | ICD-10-CM | POA: Insufficient documentation

## 2014-02-11 DIAGNOSIS — F17200 Nicotine dependence, unspecified, uncomplicated: Secondary | ICD-10-CM | POA: Insufficient documentation

## 2014-02-11 DIAGNOSIS — Z8632 Personal history of gestational diabetes: Secondary | ICD-10-CM | POA: Insufficient documentation

## 2014-02-11 DIAGNOSIS — Z8679 Personal history of other diseases of the circulatory system: Secondary | ICD-10-CM | POA: Insufficient documentation

## 2014-02-11 DIAGNOSIS — M79609 Pain in unspecified limb: Secondary | ICD-10-CM | POA: Insufficient documentation

## 2014-02-11 DIAGNOSIS — Z8709 Personal history of other diseases of the respiratory system: Secondary | ICD-10-CM | POA: Insufficient documentation

## 2014-02-11 DIAGNOSIS — Z792 Long term (current) use of antibiotics: Secondary | ICD-10-CM | POA: Insufficient documentation

## 2014-02-11 DIAGNOSIS — Z8659 Personal history of other mental and behavioral disorders: Secondary | ICD-10-CM | POA: Insufficient documentation

## 2014-02-11 DIAGNOSIS — Z8619 Personal history of other infectious and parasitic diseases: Secondary | ICD-10-CM | POA: Diagnosis not present

## 2014-02-11 MED ORDER — NAPROXEN 250 MG PO TABS
500.0000 mg | ORAL_TABLET | Freq: Once | ORAL | Status: AC
  Administered 2014-02-11: 500 mg via ORAL
  Filled 2014-02-11: qty 2

## 2014-02-11 MED ORDER — ACETAMINOPHEN-CODEINE #3 300-30 MG PO TABS: 1.0000 | ORAL_TABLET | Freq: Four times a day (QID) | ORAL | Status: DC | PRN

## 2014-02-11 MED ORDER — NAPROXEN 500 MG PO TABS: 500.0000 mg | ORAL_TABLET | Freq: Two times a day (BID) | ORAL | Status: DC

## 2014-02-11 NOTE — Discharge Instructions (Signed)
Plantar Fasciitis Plantar fasciitis is a common condition that causes foot pain. It is soreness (inflammation) of the band of tough fibrous tissue on the bottom of the foot that runs from the heel bone (calcaneus) to the ball of the foot. The cause of this soreness may be from excessive standing, poor fitting shoes, running on hard surfaces, being overweight, having an abnormal walk, or overuse (this is common in runners) of the painful foot or feet. It is also common in aerobic exercise dancers and ballet dancers.   PLEASE SEE THE ORTHOPEDIC MD LISTED ABOVE FOR ADDITIONAL MANAGEMENT IF NOT IMPROVING FROM THE PRESCRIBED MEDICATIONS. SYMPTOMS  Most people with plantar fasciitis complain of:  Severe pain in the morning on the bottom of their foot especially when taking the first steps out of bed. This pain recedes after a few minutes of walking.  Severe pain is experienced also during walking following a long period of inactivity.  Pain is worse when walking barefoot or up stairs DIAGNOSIS   Your caregiver will diagnose this condition by examining and feeling your foot.  Special tests such as X-rays of your foot, are usually not needed. PREVENTION   Consult a sports medicine professional before beginning a new exercise program.  Walking programs offer a good workout. With walking there is a lower chance of overuse injuries common to runners. There is less impact and less jarring of the joints.  Begin all new exercise programs slowly. If problems or pain develop, decrease the amount of time or distance until you are at a comfortable level.  Wear good shoes and replace them regularly.  Stretch your foot and the heel cords at the back of the ankle (Achilles tendon) both before and after exercise.  Run or exercise on even surfaces that are not hard. For example, asphalt is better than pavement.  Do not run barefoot on hard surfaces.  If using a treadmill, vary the incline.  Do not continue  to workout if you have foot or joint problems. Seek professional help if they do not improve. HOME CARE INSTRUCTIONS   Avoid activities that cause you pain until you recover.  Use ice or cold packs on the problem or painful areas after working out.  Only take over-the-counter or prescription medicines for pain, discomfort, or fever as directed by your caregiver.  Soft shoe inserts or athletic shoes with air or gel sole cushions may be helpful.  If problems continue or become more severe, consult a sports medicine caregiver or your own health care provider. Cortisone is a potent anti-inflammatory medication that may be injected into the painful area. You can discuss this treatment with your caregiver. MAKE SURE YOU:   Understand these instructions.  Will watch your condition.  Will get help right away if you are not doing well or get worse. Document Released: 02/22/2001 Document Revised: 08/22/2011 Document Reviewed: 04/23/2008 [facility] Patient Information 2015 [place], [place]. This information is not intended to replace advice given to you by your health care provider. Make sure you discuss any questions you have with your health care provider.

## 2014-02-11 NOTE — ED Provider Notes (Signed)
Medical screening examination/treatment/procedure(s) were performed by non-physician practitioner and as supervising physician I was immediately available for consultation/collaboration.   EKG Interpretation None        Maudry Diego, MD 02/11/14 1455

## 2014-02-11 NOTE — ED Provider Notes (Signed)
CSN: [id number]     Arrival date & time 02/11/14  0741 History   First MD Initiated Contact with Patient 02/11/14 [phone]     Chief Complaint  Patient presents with  . Foot Pain     (Consider location/radiation/quality/duration/timing/severity/associated sxs/prior Treatment) HPI Comments: Patient is a 33 year old female who presents to the emergency department with complaint of left foot pain. The patient states that she got up early to go to her job side, and at about 2:00 this morning she had pain at the bottom of her left foot that would not allow her to put her weight on her foot. The patient states she has a history of heel spurs. She's experienced similar problem in her right foot. She was out of her naproxen, and did not get much relief from ibuprofen. She presents now for assistance with this problem.  Patient is a 33 y.o. female presenting with lower extremity pain. The history is provided by the patient.  Foot Pain This is a recurrent problem. The current episode started today. Pertinent negatives include no abdominal pain, arthralgias, chest pain, coughing or neck pain.    Past Medical History  Diagnosis Date  . Dysrhythmia   . Gestational diabetes   . Anxiety   . Bacterial vaginosis   . Bronchitis    Past Surgical History  Procedure Laterality Date  . Induced abortion    . Cesarean section    . Tonsillectomy    . Tubal ligation     Family History  Problem Relation Age of Onset  . Diabetes Mother   . Hypertension Mother   . Diabetes Other   . Hypertension Other    History  Substance Use Topics  . Smoking status: Current Every Day Smoker -- 0.50 packs/day    Types: Cigarettes  . Smokeless tobacco: Never Used  . Alcohol Use: No   OB History   Grav Para Term Preterm Abortions TAB SAB Ect Mult Living   3 2 2  1 1    2      Review of Systems  Constitutional: Negative for activity change.       All ROS Neg except as noted in HPI  Eyes: Negative for photophobia  and discharge.  Respiratory: Negative for cough, shortness of breath and wheezing.   Cardiovascular: Negative for chest pain and palpitations.  Gastrointestinal: Negative for abdominal pain and blood in stool.  Genitourinary: Negative for dysuria, frequency and hematuria.  Musculoskeletal: Negative for arthralgias, back pain and neck pain.  Skin: Negative.   Neurological: Negative for dizziness, seizures and speech difficulty.  Psychiatric/Behavioral: Negative for hallucinations and confusion.      Allergies  Milk-related compounds  Home Medications   Prior to Admission medications   Medication Sig Start Date End Date Taking? Authorizing Provider  acetaminophen-codeine (TYLENOL #3) 300-30 MG per tablet Take 1-2 tablets by mouth every 6 (six) hours as needed. 01/24/14   Lenox Ahr, PA-C  acidophilus (RISAQUAD) CAPS capsule Take 1 capsule by mouth daily.    Historical Provider, MD  amoxicillin (AMOXIL) 500 MG capsule Take 1 capsule (500 mg total) by mouth 3 (three) times daily. 01/24/14   Lenox Ahr, PA-C  ibuprofen (ADVIL,MOTRIN) 200 MG tablet Take 800 mg by mouth every 6 (six) hours as needed for moderate pain.    Historical Provider, MD   BP 138/95  Pulse 77  Temp(Src) 98.2 F (36.8 C) (Oral)  Resp 18  Ht 5\' 7"  (1.702 m)  Wt 213 lb (96.616  kg)  BMI 33.35 kg/m2  SpO2 100%  LMP 01/27/2014 Physical Exam  Nursing note and vitals reviewed. Constitutional: She is oriented to person, place, and time. She appears well-developed and well-nourished.  Non-toxic appearance.  HENT:  Head: Normocephalic.  Right Ear: Tympanic membrane and external ear normal.  Left Ear: Tympanic membrane and external ear normal.  Eyes: EOM and lids are normal. Pupils are equal, round, and reactive to light.  Neck: Normal range of motion. Neck supple. Carotid bruit is not present.  Cardiovascular: Normal rate, regular rhythm, normal heart sounds, intact distal pulses and normal pulses.    Pulmonary/Chest: Breath sounds normal. No respiratory distress.  Abdominal: Soft. Bowel sounds are normal. There is no tenderness. There is no guarding.  Musculoskeletal: Normal range of motion.  There is full range of motion of the left hip and knee. The Achilles tendon on the left is intact. The dorsalis pedis and posterior tibial pulses are 2+ bilaterally. There no lesions noted between the toes of the left foot. There no puncture wounds or warm areas involving the plantar surface of the left foot. Pain can be reproduced by deep palpation of the mid foot extending down to the heel.  Lymphadenopathy:       Head (right side): No submandibular adenopathy present.       Head (left side): No submandibular adenopathy present.    She has no cervical adenopathy.  Neurological: She is alert and oriented to person, place, and time. She has normal strength. No cranial nerve deficit or sensory deficit.  Skin: Skin is warm and dry.  Psychiatric: She has a normal mood and affect. Her speech is normal.    ED Course  Procedures (including critical care time) Labs Review Labs Reviewed - No data to display  Imaging Review No results found.   EKG Interpretation None      MDM Vital signs are within normal limits. The examination is consistent with plantar fasciitis. Patient will be treated with naproxen and Norco. Patient is given the name of orthopedics for additional evaluation if not improving.    Final diagnoses:  None    *I have reviewed nursing notes, vital signs, and all appropriate lab and imaging results for this patient.Lenox Ahr, PA-C 02/11/14 [phone]

## 2014-02-11 NOTE — ED Notes (Signed)
Pt c/o pain in left foot that started this morning.  Reports has heel spurs.

## 2014-03-13 ENCOUNTER — Emergency Department (HOSPITAL_COMMUNITY): Payer: BC Managed Care – PPO

## 2014-03-13 ENCOUNTER — Encounter (HOSPITAL_COMMUNITY): Payer: Self-pay | Admitting: Emergency Medicine

## 2014-03-13 ENCOUNTER — Emergency Department (HOSPITAL_COMMUNITY)
Admission: EM | Admit: 2014-03-13 | Discharge: 2014-03-13 | Disposition: A | Discharge status: Home / Self Care | Payer: BC Managed Care – PPO | Attending: Emergency Medicine | Admitting: Emergency Medicine

## 2014-03-13 DIAGNOSIS — Z8679 Personal history of other diseases of the circulatory system: Secondary | ICD-10-CM | POA: Insufficient documentation

## 2014-03-13 DIAGNOSIS — Z8742 Personal history of other diseases of the female genital tract: Secondary | ICD-10-CM | POA: Diagnosis not present

## 2014-03-13 DIAGNOSIS — S199XXA Unspecified injury of neck, initial encounter: Secondary | ICD-10-CM | POA: Diagnosis present

## 2014-03-13 DIAGNOSIS — Z720 Tobacco use: Secondary | ICD-10-CM | POA: Insufficient documentation

## 2014-03-13 DIAGNOSIS — Z8619 Personal history of other infectious and parasitic diseases: Secondary | ICD-10-CM | POA: Insufficient documentation

## 2014-03-13 DIAGNOSIS — S0990XA Unspecified injury of head, initial encounter: Secondary | ICD-10-CM | POA: Insufficient documentation

## 2014-03-13 DIAGNOSIS — Y9389 Activity, other specified: Secondary | ICD-10-CM | POA: Insufficient documentation

## 2014-03-13 DIAGNOSIS — Z8632 Personal history of gestational diabetes: Secondary | ICD-10-CM | POA: Insufficient documentation

## 2014-03-13 DIAGNOSIS — Y92410 Unspecified street and highway as the place of occurrence of the external cause: Secondary | ICD-10-CM | POA: Diagnosis not present

## 2014-03-13 DIAGNOSIS — Z8709 Personal history of other diseases of the respiratory system: Secondary | ICD-10-CM | POA: Insufficient documentation

## 2014-03-13 DIAGNOSIS — Z8659 Personal history of other mental and behavioral disorders: Secondary | ICD-10-CM | POA: Insufficient documentation

## 2014-03-13 DIAGNOSIS — S161XXA Strain of muscle, fascia and tendon at neck level, initial encounter: Secondary | ICD-10-CM | POA: Insufficient documentation

## 2014-03-13 DIAGNOSIS — Z791 Long term (current) use of non-steroidal anti-inflammatories (NSAID): Secondary | ICD-10-CM | POA: Insufficient documentation

## 2014-03-13 MED ORDER — IBUPROFEN 800 MG PO TABS
800.0000 mg | ORAL_TABLET | Freq: Once | ORAL | Status: AC
  Administered 2014-03-13: 800 mg via ORAL
  Filled 2014-03-13: qty 1

## 2014-03-13 MED ORDER — HYDROCODONE-ACETAMINOPHEN 5-325 MG PO TABS: 2.0000 | ORAL_TABLET | ORAL | Status: DC | PRN

## 2014-03-13 MED ORDER — IBUPROFEN 800 MG PO TABS: 800.0000 mg | ORAL_TABLET | Freq: Three times a day (TID) | ORAL | Status: DC
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
## 2014-03-13 NOTE — Discharge Instructions (Signed)
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
## 2014-04-14 ENCOUNTER — Encounter (HOSPITAL_COMMUNITY): Payer: Self-pay | Admitting: Emergency Medicine

## 2014-06-26 ENCOUNTER — Emergency Department (HOSPITAL_COMMUNITY)
Admission: EM | Admit: 2014-06-26 | Discharge: 2014-06-26 | Disposition: A | Discharge status: Home / Self Care | Payer: Self-pay | Attending: Emergency Medicine | Admitting: Emergency Medicine

## 2014-06-26 ENCOUNTER — Encounter (HOSPITAL_COMMUNITY): Payer: Self-pay | Admitting: Emergency Medicine

## 2014-06-26 ENCOUNTER — Emergency Department (HOSPITAL_COMMUNITY): Payer: Self-pay

## 2014-06-26 DIAGNOSIS — Z87448 Personal history of other diseases of urinary system: Secondary | ICD-10-CM | POA: Insufficient documentation

## 2014-06-26 DIAGNOSIS — J209 Acute bronchitis, unspecified: Secondary | ICD-10-CM | POA: Insufficient documentation

## 2014-06-26 DIAGNOSIS — Z720 Tobacco use: Secondary | ICD-10-CM | POA: Insufficient documentation

## 2014-06-26 DIAGNOSIS — Z79899 Other long term (current) drug therapy: Secondary | ICD-10-CM | POA: Insufficient documentation

## 2014-06-26 DIAGNOSIS — K047 Periapical abscess without sinus: Secondary | ICD-10-CM | POA: Insufficient documentation

## 2014-06-26 DIAGNOSIS — J40 Bronchitis, not specified as acute or chronic: Secondary | ICD-10-CM

## 2014-06-26 DIAGNOSIS — R05 Cough: Secondary | ICD-10-CM

## 2014-06-26 DIAGNOSIS — R51 Headache: Secondary | ICD-10-CM | POA: Insufficient documentation

## 2014-06-26 DIAGNOSIS — R059 Cough, unspecified: Secondary | ICD-10-CM

## 2014-06-26 DIAGNOSIS — Z8619 Personal history of other infectious and parasitic diseases: Secondary | ICD-10-CM | POA: Insufficient documentation

## 2014-06-26 MED ORDER — ALBUTEROL SULFATE HFA 108 (90 BASE) MCG/ACT IN AERS: 1.0000 | INHALATION_SPRAY | Freq: Four times a day (QID) | RESPIRATORY_TRACT | Status: AC | PRN

## 2014-06-26 MED ORDER — PHENYLEPH-PROMETHAZINE-COD 5-6.25-10 MG/5ML PO SYRP: 5.0000 mL | ORAL_SOLUTION | Freq: Two times a day (BID) | ORAL | Status: DC | PRN

## 2014-06-26 MED ORDER — AMOXICILLIN 500 MG PO CAPS: 500.0000 mg | ORAL_CAPSULE | Freq: Three times a day (TID) | ORAL | Status: DC

## 2014-06-26 NOTE — ED Notes (Signed)
PA at bedside.

## 2014-06-26 NOTE — ED Provider Notes (Signed)
CSN: [id number]     Arrival date & time 06/26/14  0855 History   First MD Initiated Contact with Patient 06/26/14 0913     Chief Complaint  Patient presents with  . Nasal Congestion     (Consider location/radiation/quality/duration/timing/severity/associated sxs/prior Treatment) Patient is a 34 y.o. female presenting with URI.  URI Presenting symptoms: congestion, cough, facial pain, fever and rhinorrhea   Presenting symptoms: no ear pain and no sore throat   Severity:  Moderate Onset quality:  Gradual Duration:  5 days Timing:  Constant Progression:  Worsening Chronicity:  New Relieved by:  Nothing Worsened by:  Nothing tried Ineffective treatments:  Inhaler and OTC medications Associated symptoms: headaches, myalgias, sinus pain, sneezing and wheezing   Associated symptoms: no swollen glands   Risk factors: sick contacts    Valerie [name] is a 34 y.o. female who presents to the ED with with nasal and chest congestion that started 5 days ago. Hx of bronchitis and has been using her inhaler and is just about out. She is an every day smoker.   Patient also complains of dental pain. She states that the left upper 3rd molar is decayed and painful and there is an abscess of the gum over the right upper second molar.   Past Medical History  Diagnosis Date  . Dysrhythmia   . Gestational diabetes   . Anxiety   . Bacterial vaginosis   . Bronchitis    Past Surgical History  Procedure Laterality Date  . Induced abortion    . Cesarean section    . Tonsillectomy    . Tubal ligation     Family History  Problem Relation Age of Onset  . Diabetes Mother   . Hypertension Mother   . Diabetes Other   . Hypertension Other    History  Substance Use Topics  . Smoking status: Current Every Day Smoker -- 0.50 packs/day    Types: Cigarettes  . Smokeless tobacco: Never Used  . Alcohol Use: No   OB History    Gravida Para Term Preterm AB TAB SAB Ectopic Multiple Living   3 2 2   1 1    2      Review of Systems  Constitutional: Positive for fever and chills.  HENT: Positive for congestion, dental problem, rhinorrhea and sneezing. Negative for ear pain and sore throat.   Eyes: Negative for pain, redness and visual disturbance.  Respiratory: Positive for cough, chest tightness and wheezing.   Cardiovascular: Negative for chest pain.  Gastrointestinal: Negative for nausea, vomiting and abdominal pain.  Genitourinary: Negative for dysuria, urgency and frequency.  Musculoskeletal: Positive for myalgias.  Skin: Negative for rash.  Neurological: Positive for headaches. Negative for syncope and speech difficulty.  Psychiatric/Behavioral: Negative for confusion. The patient is not nervous/anxious.       Allergies  Milk-related compounds  Home Medications   Prior to Admission medications   Medication Sig Start Date End Date Taking? Authorizing Provider  diphenhydrAMINE (BENADRYL) 25 MG tablet Take 25 mg by mouth every 6 (six) hours as needed for allergies.   Yes Historical Provider, MD  ibuprofen (ADVIL,MOTRIN) 200 MG tablet Take 800 mg by mouth 2 (two) times daily as needed for moderate pain.   Yes Historical Provider, MD  albuterol (PROVENTIL HFA;VENTOLIN HFA) 108 (90 BASE) MCG/ACT inhaler Inhale 1-2 puffs into the lungs every 6 (six) hours as needed for wheezing or shortness of breath. 06/26/14   Brendin Situ Bunnie Pion, NP  amoxicillin (AMOXIL) 500 MG  capsule Take 1 capsule (500 mg total) by mouth 3 (three) times daily. 06/26/14   Lelan Cush Bunnie Pion, NP  Phenyleph-Promethazine-Cod (PROMETHAZINE VC/CODEINE) 5-6.25-10 MG/5ML SYRP Take 5 mLs by mouth every 12 (twelve) hours as needed. 06/26/14   Demiya Magno Bunnie Pion, NP   BP 129/79 mmHg  Pulse 89  Temp(Src) 98.5 F (36.9 C) (Oral)  Resp 18  Ht 5\' 7"  (1.702 m)  Wt 210 lb (95.255 kg)  BMI 32.88 kg/m2  SpO2 99%  LMP 05/26/2014 Physical Exam  Constitutional: She is oriented to person, place, and time. She appears well-developed and  well-nourished. No distress.  HENT:  Head: Normocephalic.  Right Ear: Tympanic membrane normal.  Left Ear: Tympanic membrane normal.  Nose: Rhinorrhea present.  Mouth/Throat: Uvula is midline and oropharynx is clear and moist.    There is tenderness on exam to the left upper third molar and the right upper second molar. There is swelling of the gum surrounding the right upper second molar consistent with abscess.   Eyes: Conjunctivae and EOM are normal.  Neck: Normal range of motion. Neck supple.  Cardiovascular: Normal rate and regular rhythm.   Pulmonary/Chest: Effort normal. She has wheezes. She has no rales.  Abdominal: Soft. There is no tenderness.  Musculoskeletal: Normal range of motion.  Lymphadenopathy:    She has no cervical adenopathy.  Neurological: She is alert and oriented to person, place, and time. No cranial nerve deficit.  Skin: Skin is warm and dry.  Psychiatric: She has a normal mood and affect. Her behavior is normal.  Nursing note and vitals reviewed.   ED Course  Procedures (including critical care time) Labs Review Labs Reviewed - No data to display  Imaging Review Dg Chest 2 View  06/26/2014   CLINICAL DATA:  Cough, chest tightness, shortness of breath and congestion for 1 week.  EXAM: CHEST  2 VIEW  COMPARISON:  PA and lateral chest 10/23/2013 and 08/19/2010.  FINDINGS: Heart size and mediastinal contours are within normal limits. Both lungs are clear. Visualized skeletal structures are unremarkable.  IMPRESSION: Negative exam.   Electronically Signed   By: Inge Rise M.D.   On: 06/26/2014 10:29     MDM  34 y.o. female with cough and wheezing and URI symptoms x 5 days. Also with dental pain and infection.  Stable for discharge without respiratory distress. O2 SAT 99% on R/A and x-ray without pneumonia. Will refill Albuterol Inhaler and treat dental abscess and pain.  Final diagnoses:  Cough  Dental abscess  Bronchitis      Ashley Murrain,  NP 06/26/14 [place], MD 06/26/14 1415

## 2014-06-26 NOTE — Care Management Note (Signed)
ED/CM noted patient did not have health insurance and/or PCP listed in the computer.  Patient was given the [facility] with information on the clinics, food pantries, and the handout for new health insurance sign-up. Pt was also given a Rx discount card. Patient expressed appreciation for information received.

## 2014-06-26 NOTE — ED Notes (Signed)
Pt report nasal and chest congestion since Sunday. Pt reports chills and cough. nad noted. Pt also reports is about to run out of inhaler.

## 2014-08-26 ENCOUNTER — Emergency Department (HOSPITAL_COMMUNITY)
Admission: EM | Admit: 2014-08-26 | Discharge: 2014-08-26 | Disposition: A | Discharge status: Home / Self Care | Payer: Self-pay | Attending: Emergency Medicine | Admitting: Emergency Medicine

## 2014-08-26 ENCOUNTER — Encounter (HOSPITAL_COMMUNITY): Payer: Self-pay | Admitting: Emergency Medicine

## 2014-08-26 ENCOUNTER — Emergency Department (HOSPITAL_COMMUNITY): Payer: Self-pay

## 2014-08-26 DIAGNOSIS — Z79899 Other long term (current) drug therapy: Secondary | ICD-10-CM | POA: Insufficient documentation

## 2014-08-26 DIAGNOSIS — S93421A Sprain of deltoid ligament of right ankle, initial encounter: Secondary | ICD-10-CM

## 2014-08-26 DIAGNOSIS — X58XXXA Exposure to other specified factors, initial encounter: Secondary | ICD-10-CM | POA: Insufficient documentation

## 2014-08-26 DIAGNOSIS — Z720 Tobacco use: Secondary | ICD-10-CM | POA: Insufficient documentation

## 2014-08-26 DIAGNOSIS — Y9289 Other specified places as the place of occurrence of the external cause: Secondary | ICD-10-CM | POA: Insufficient documentation

## 2014-08-26 DIAGNOSIS — F419 Anxiety disorder, unspecified: Secondary | ICD-10-CM | POA: Insufficient documentation

## 2014-08-26 DIAGNOSIS — S93401A Sprain of unspecified ligament of right ankle, initial encounter: Secondary | ICD-10-CM | POA: Insufficient documentation

## 2014-08-26 DIAGNOSIS — Y9389 Activity, other specified: Secondary | ICD-10-CM | POA: Insufficient documentation

## 2014-08-26 DIAGNOSIS — Y998 Other external cause status: Secondary | ICD-10-CM | POA: Insufficient documentation

## 2014-08-26 DIAGNOSIS — Z8632 Personal history of gestational diabetes: Secondary | ICD-10-CM | POA: Insufficient documentation

## 2014-08-26 DIAGNOSIS — Z8742 Personal history of other diseases of the female genital tract: Secondary | ICD-10-CM | POA: Insufficient documentation

## 2014-08-26 DIAGNOSIS — Z8619 Personal history of other infectious and parasitic diseases: Secondary | ICD-10-CM | POA: Insufficient documentation

## 2014-08-26 MED ORDER — TRAMADOL HCL 50 MG PO TABS: 50.0000 mg | ORAL_TABLET | Freq: Four times a day (QID) | ORAL | Status: DC | PRN

## 2014-08-26 NOTE — ED Notes (Signed)
Patient with no complaints at this time. Respirations even and unlabored. Skin warm/dry. Discharge instructions reviewed with patient at this time. Patient given opportunity to voice concerns/ask questions. Patient discharged at this time and left Emergency Department with steady gait.   

## 2014-08-26 NOTE — ED Notes (Signed)
PT stated she was walking outside onto her porch and twisted her right ankle. PT c/o right ankle pain and swelling with painful ROM. PT ambulatory in triage with weight bearing.

## 2014-08-26 NOTE — Discharge Instructions (Signed)
Ankle Sprain  An ankle sprain is an injury to the strong, fibrous tissues (ligaments) that hold your ankle bones together.   HOME CARE   · Put ice on your ankle for 1-2 days or as told by your doctor.  ¨ Put ice in a plastic bag.  ¨ Place a towel between your skin and the bag.  ¨ Leave the ice on for 15-20 minutes at a time, every 2 hours while you are awake.  · Only take medicine as told by your doctor.  · Raise (elevate) your injured ankle above the level of your heart as much as possible for 2-3 days.  · Use crutches if your doctor tells you to. Slowly put your own weight on the affected ankle. Use the crutches until you can walk without pain.  · If you have a plaster splint:  ¨ Do not rest it on anything harder than a pillow for 24 hours.  ¨ Do not put weight on it.  ¨ Do not get it wet.  ¨ Take it off to shower or bathe.  · If given, use an elastic wrap or support stocking for support. Take the wrap off if your toes lose feeling (numb), tingle, or turn cold or blue.  · If you have an air splint:  ¨ Add or let out air to make it comfortable.  ¨ Take it off at night and to shower and bathe.  ¨ Wiggle your toes and move your ankle up and down often while you are wearing it.  GET HELP IF:  · You have rapidly increasing bruising or puffiness (swelling).  · Your toes feel very cold.  · You lose feeling in your foot.  · Your medicine does not help your pain.  GET HELP RIGHT AWAY IF:   · Your toes lose feeling (numb) or turn blue.  · You have severe pain that is increasing.  MAKE SURE YOU:   · Understand these instructions.  · Will watch your condition.  · Will get help right away if you are not doing well or get worse.  Document Released: 11/16/2007 Document Revised: 10/14/2013 Document Reviewed: 12/12/2011  [employer]® Patient Information ©2015 [employer]. This information is not intended to replace advice given to you by your health care provider. Make sure you discuss any questions you have with your health care  provider.

## 2014-08-28 NOTE — ED Provider Notes (Signed)
CSN: [id number]     Arrival date & time 08/26/14  6283 History   First MD Initiated Contact with Patient 08/26/14 [phone]     Chief Complaint  Patient presents with  . Ankle Pain     (Consider location/radiation/quality/duration/timing/severity/associated sxs/prior Treatment) HPI  Valerie [name] is a 34 y.o. female who presents to the Emergency Department complaining of right ankle pain for several hours.  She states that she twisted her ankle but denies a fall.  She reports pain with weigh bearing. She has not tried any therapies,  She denies numbness, weakness, or there injuries.     Past Medical History  Diagnosis Date  . Dysrhythmia   . Gestational diabetes   . Anxiety   . Bacterial vaginosis   . Bronchitis    Past Surgical History  Procedure Laterality Date  . Induced abortion    . Cesarean section    . Tonsillectomy    . Tubal ligation     Family History  Problem Relation Age of Onset  . Diabetes Mother   . Hypertension Mother   . Diabetes Other   . Hypertension Other    History  Substance Use Topics  . Smoking status: Current Every Day Smoker -- 0.50 packs/day    Types: Cigarettes  . Smokeless tobacco: Never Used  . Alcohol Use: No   OB History    Gravida Para Term Preterm AB TAB SAB Ectopic Multiple Living   3 2 2  1 1    2      Review of Systems  Constitutional: Negative for fever and chills.  Genitourinary: Negative for dysuria and difficulty urinating.  Musculoskeletal: Positive for joint swelling and arthralgias (right ankle tenderness).  Skin: Negative for color change and wound.  All other systems reviewed and are negative.     Allergies  Milk-related compounds  Home Medications   Prior to Admission medications   Medication Sig Start Date End Date Taking? Authorizing Provider  albuterol (PROVENTIL HFA;VENTOLIN HFA) 108 (90 BASE) MCG/ACT inhaler Inhale 1-2 puffs into the lungs every 6 (six) hours as needed for wheezing or shortness of  breath. 06/26/14  Yes Hope Bunnie Pion, NP  diphenhydrAMINE (BENADRYL) 25 MG tablet Take 25 mg by mouth every 6 (six) hours as needed for allergies.   Yes Historical Provider, MD  ibuprofen (ADVIL,MOTRIN) 200 MG tablet Take 800 mg by mouth 2 (two) times daily as needed for moderate pain.   Yes Historical Provider, MD  traMADol (ULTRAM) 50 MG tablet Take 1 tablet (50 mg total) by mouth every 6 (six) hours as needed. 08/26/14   Tammi London Tarnowski, PA-C   BP 138/88 mmHg  Pulse 87  Temp(Src) 98 F (36.7 C) (Oral)  Resp 18  Ht 5\' 7"  (1.702 m)  Wt 215 lb (97.523 kg)  BMI 33.67 kg/m2  SpO2 96%  LMP 08/23/2014 Physical Exam  Constitutional: She is oriented to person, place, and time. She appears well-developed and well-nourished. No distress.  HENT:  Head: Normocephalic and atraumatic.  Cardiovascular: Normal rate, regular rhythm, normal heart sounds and intact distal pulses.   Pulmonary/Chest: Effort normal and breath sounds normal. No respiratory distress.  Musculoskeletal: She exhibits tenderness.  Tenderness of the right lateral ankle, mild STS is present.  ROM is preserved.  DP pulse is brisk,distal sensation intact.  No erythema, abrasion, bruising or bony deformity.  No proximal tenderness.  Neurological: She is alert and oriented to person, place, and time. She exhibits normal muscle tone. Coordination normal.  Skin: Skin is warm and dry.  Nursing note and vitals reviewed.   ED Course  Procedures (including critical care time) Labs Review Labs Reviewed - No data to display  Imaging Review Dg Ankle Complete Right  08/26/2014   CLINICAL DATA:  Recent inversion injury with ankle pain and swelling, initial encounter  EXAM: RIGHT ANKLE - COMPLETE 3+ VIEW  COMPARISON:  None.  FINDINGS: Lateral soft tissue swelling is noted consistent with a recent injury. No acute fracture or dislocation is noted. Small plantar spur is noted arising from the calcaneus.  IMPRESSION: Lateral soft tissue swelling  without acute bony abnormality.   Electronically Signed   By: Inez Catalina M.D.   On: 08/26/2014 09:25      EKG Interpretation None      MDM   Final diagnoses:  Sprain, ankle joint, medial, right, initial encounter    Likely sprain of the ankle.  No bony deformity.  NV intact.  ASO splint applied.  Pain improved.  Pt agrees to close ortho f/u    Patrice Paradise, PA-C 08/28/14 [place], MD 08/30/14 [phone]

## 2014-09-20 ENCOUNTER — Emergency Department (HOSPITAL_COMMUNITY)
Admission: EM | Admit: 2014-09-20 | Discharge: 2014-09-20 | Disposition: A | Discharge status: Home / Self Care | Payer: Self-pay | Attending: Emergency Medicine | Admitting: Emergency Medicine

## 2014-09-20 ENCOUNTER — Encounter (HOSPITAL_COMMUNITY): Payer: Self-pay | Admitting: Emergency Medicine

## 2014-09-20 DIAGNOSIS — Z79899 Other long term (current) drug therapy: Secondary | ICD-10-CM | POA: Insufficient documentation

## 2014-09-20 DIAGNOSIS — Z8709 Personal history of other diseases of the respiratory system: Secondary | ICD-10-CM | POA: Insufficient documentation

## 2014-09-20 DIAGNOSIS — Z8632 Personal history of gestational diabetes: Secondary | ICD-10-CM | POA: Insufficient documentation

## 2014-09-20 DIAGNOSIS — N939 Abnormal uterine and vaginal bleeding, unspecified: Secondary | ICD-10-CM | POA: Insufficient documentation

## 2014-09-20 DIAGNOSIS — Z8679 Personal history of other diseases of the circulatory system: Secondary | ICD-10-CM | POA: Insufficient documentation

## 2014-09-20 DIAGNOSIS — Z3202 Encounter for pregnancy test, result negative: Secondary | ICD-10-CM | POA: Insufficient documentation

## 2014-09-20 DIAGNOSIS — F419 Anxiety disorder, unspecified: Secondary | ICD-10-CM | POA: Insufficient documentation

## 2014-09-20 DIAGNOSIS — Z720 Tobacco use: Secondary | ICD-10-CM | POA: Insufficient documentation

## 2014-09-20 LAB — CBC WITH DIFFERENTIAL/PLATELET
Basophils Absolute: 0 10*3/uL (ref 0.0–0.1)
Basophils Relative: 0 % (ref 0–1)
EOS ABS: 0.2 10*3/uL (ref 0.0–0.7)
EOS PCT: 3 % (ref 0–5)
HEMATOCRIT: 38 % (ref 36.0–46.0)
HEMOGLOBIN: 13.3 g/dL (ref 12.0–15.0)
Lymphocytes Relative: 39 % (ref 12–46)
Lymphs Abs: 2.9 10*3/uL (ref 0.7–4.0)
MCH: 29.3 pg (ref 26.0–34.0)
MCHC: 35 g/dL (ref 30.0–36.0)
MCV: 83.7 fL (ref 78.0–100.0)
MONO ABS: 0.2 10*3/uL (ref 0.1–1.0)
Monocytes Relative: 3 % (ref 3–12)
NEUTROS ABS: 3.9 10*3/uL (ref 1.7–7.7)
NEUTROS PCT: 55 % (ref 43–77)
Platelets: 218 10*3/uL (ref 150–400)
RBC: 4.54 MIL/uL (ref 3.87–5.11)
RDW: 12.4 % (ref 11.5–15.5)
WBC: 7.3 10*3/uL (ref 4.0–10.5)

## 2014-09-20 LAB — BASIC METABOLIC PANEL
Anion gap: 6 (ref 5–15)
BUN: 11 mg/dL (ref 6–23)
CO2: 24 mmol/L (ref 19–32)
Calcium: 8.6 mg/dL (ref 8.4–10.5)
Chloride: 106 mmol/L (ref 96–112)
Creatinine, Ser: 0.69 mg/dL (ref 0.50–1.10)
GFR calc Af Amer: >90 mL/min (ref >90)
GFR calc non Af Amer: >90 mL/min (ref >90)
Glucose, Bld: 125 mg/dL — ABNORMAL HIGH (ref 70–99)
Potassium: 3.9 mmol/L (ref 3.5–5.1)
SODIUM: 136 mmol/L (ref 135–145)

## 2014-09-20 LAB — PREGNANCY, URINE: Preg Test, Ur: NEGATIVE

## 2014-09-20 MED ORDER — SODIUM CHLORIDE 0.9 % IV BOLUS (SEPSIS)
1000.0000 mL | Freq: Once | INTRAVENOUS | Status: AC
  Administered 2014-09-20: 1000 mL via INTRAVENOUS

## 2014-09-20 NOTE — ED Provider Notes (Signed)
CSN: [id number]     Arrival date & time 09/20/14  1046 History  This chart was scribed for Milton Ferguson, MD by Erling Conte, ED Scribe. This patient was seen in room APA06/[facility]06 and the patient's care was started at 11:51 AM.    Chief Complaint  Patient presents with  . Vaginal Bleeding    Patient is a 34 y.o. female presenting with vaginal bleeding. The history is provided by the patient. No language interpreter was used.  Vaginal Bleeding Quality:  Clots Severity:  Moderate Onset quality:  Sudden Duration:  1 day Timing:  Intermittent Chronicity:  New Menstrual history:  Regular Context comment:  Menstrual period Relieved by:  Nothing Worsened by:  Nothing tried Ineffective treatments:  None tried Associated symptoms: abdominal pain and dizziness   Associated symptoms: no back pain and no fatigue     HPI Comments: Valerie [name] is a 34 y.o. female who presents to the Emergency Department complaining of increased vaginal bleeding consistent of blood clots that began today. Pt is current on menstrual period. She is having associated intermittent dizziness and mild suprapubic tenderness She denies having menstrual periods this severe in the past.She notes her menstrual period last month was normal. She denies any other complaints.   Past Medical History  Diagnosis Date  . Dysrhythmia   . Gestational diabetes   . Anxiety   . Bacterial vaginosis   . Bronchitis    Past Surgical History  Procedure Laterality Date  . Induced abortion    . Cesarean section    . Tonsillectomy    . Tubal ligation     Family History  Problem Relation Age of Onset  . Diabetes Mother   . Hypertension Mother   . Diabetes Other   . Hypertension Other    History  Substance Use Topics  . Smoking status: Current Every Day Smoker -- 0.50 packs/day    Types: Cigarettes  . Smokeless tobacco: Never Used  . Alcohol Use: No   OB History    Gravida Para Term Preterm AB TAB SAB Ectopic  Multiple Living   3 2 2  1 1    2      Review of Systems  Constitutional: Negative for appetite change and fatigue.  HENT: Negative for congestion, ear discharge and sinus pressure.   Eyes: Negative for discharge.  Respiratory: Negative for cough.   Cardiovascular: Negative for chest pain.  Gastrointestinal: Positive for abdominal pain. Negative for diarrhea.  Genitourinary: Positive for vaginal bleeding. Negative for frequency and hematuria.  Musculoskeletal: Negative for back pain.  Skin: Negative for rash.  Neurological: Positive for dizziness. Negative for seizures and headaches.  Psychiatric/Behavioral: Negative for hallucinations.      Allergies  Milk-related compounds  Home Medications   Prior to Admission medications   Medication Sig Start Date End Date Taking? Authorizing Provider  albuterol (PROVENTIL HFA;VENTOLIN HFA) 108 (90 BASE) MCG/ACT inhaler Inhale 1-2 puffs into the lungs every 6 (six) hours as needed for wheezing or shortness of breath. 06/26/14  Yes Hope Bunnie Pion, NP  ibuprofen (ADVIL,MOTRIN) 200 MG tablet Take 800 mg by mouth 2 (two) times daily as needed for moderate pain.   Yes Historical Provider, MD  traMADol (ULTRAM) 50 MG tablet Take 1 tablet (50 mg total) by mouth every 6 (six) hours as needed. Patient not taking: Reported on 09/20/2014 08/26/14   Patrice Paradise, PA-C   Triage Vitals: BP 161/94 mmHg  Pulse 79  Temp(Src) 98.4 F (36.9 C) (Oral)  Resp 18  Ht 5\' 7"  (1.702 m)  Wt 205 lb (92.987 kg)  BMI 32.10 kg/m2  SpO2 93%  LMP 09/20/2014  Physical Exam  Constitutional: She is oriented to person, place, and time. She appears well-developed.  HENT:  Head: Normocephalic.  Eyes: Conjunctivae and EOM are normal. No scleral icterus.  Neck: Neck supple. No thyromegaly present.  Cardiovascular: Normal rate and regular rhythm.  Exam reveals no gallop and no friction rub.   No murmur heard. Pulmonary/Chest: No stridor. She has no wheezes. She has no  rales. She exhibits no tenderness.  Abdominal: She exhibits no distension. There is tenderness (minimal) in the suprapubic area. There is no rebound.  Genitourinary:  Moderate blood in vault. O/w nl exam  Musculoskeletal: Normal range of motion. She exhibits no edema.  Lymphadenopathy:    She has no cervical adenopathy.  Neurological: She is oriented to person, place, and time. She exhibits normal muscle tone. Coordination normal.  Skin: No rash noted. No erythema.  Psychiatric: She has a normal mood and affect. Her behavior is normal.    ED Course  Procedures (including critical care time)  DIAGNOSTIC STUDIES: Oxygen Saturation is 93% on RA, normal by my interpretation.    COORDINATION OF CARE: 11:57 AM- Will order   Labs Review Labs Reviewed  CBC WITH DIFFERENTIAL/PLATELET  PREGNANCY, URINE  BASIC METABOLIC PANEL    Imaging Review No results found.   EKG Interpretation None      MDM   Final diagnoses:  None    Irregular menses,  Pt to follow up with gyn md  The chart was scribed for me under my direct supervision.  I personally performed the history, physical, and medical decision making and all procedures in the evaluation of this patient.Milton Ferguson, MD 09/20/14 [phone]

## 2014-09-20 NOTE — ED Notes (Signed)
Pt reports is on menstrual period but reports "a large increase in amount of blood" since last night. pt also reports intermittent dizziness since this am.nad noted.

## 2014-09-20 NOTE — Discharge Instructions (Signed)
Follow up with dr. Glo Herring this week.

## 2014-09-20 NOTE — ED Notes (Signed)
Pt was very upset that she had "wasted" her entire morning in the er for the doctor just to look at her cervix and tell her to go to an ob/gyn.  Pt states that her problem had resolved on its own anyway and that she was going to go home.  Dr Roderic Palau made aware and d/c instructions obtained.

## 2014-09-20 NOTE — ED Notes (Signed)
Pt states that she removed tampon while obtaining Urine sample and that bleeding has lessened at this time.  Denies being lightheaded or dizzy.  States that was mostly when she woke up this morning.  States some abdominal cramping rates as mild.

## 2014-09-21 IMAGING — CR DG CHEST 2V
3 series · 3 of 3 positions shown · non-contrast
Comparison: 10/18/2013

CLINICAL DATA: Chronic cough

EXAM:
CHEST  2 VIEW

[view not recorded (1 of 3)]
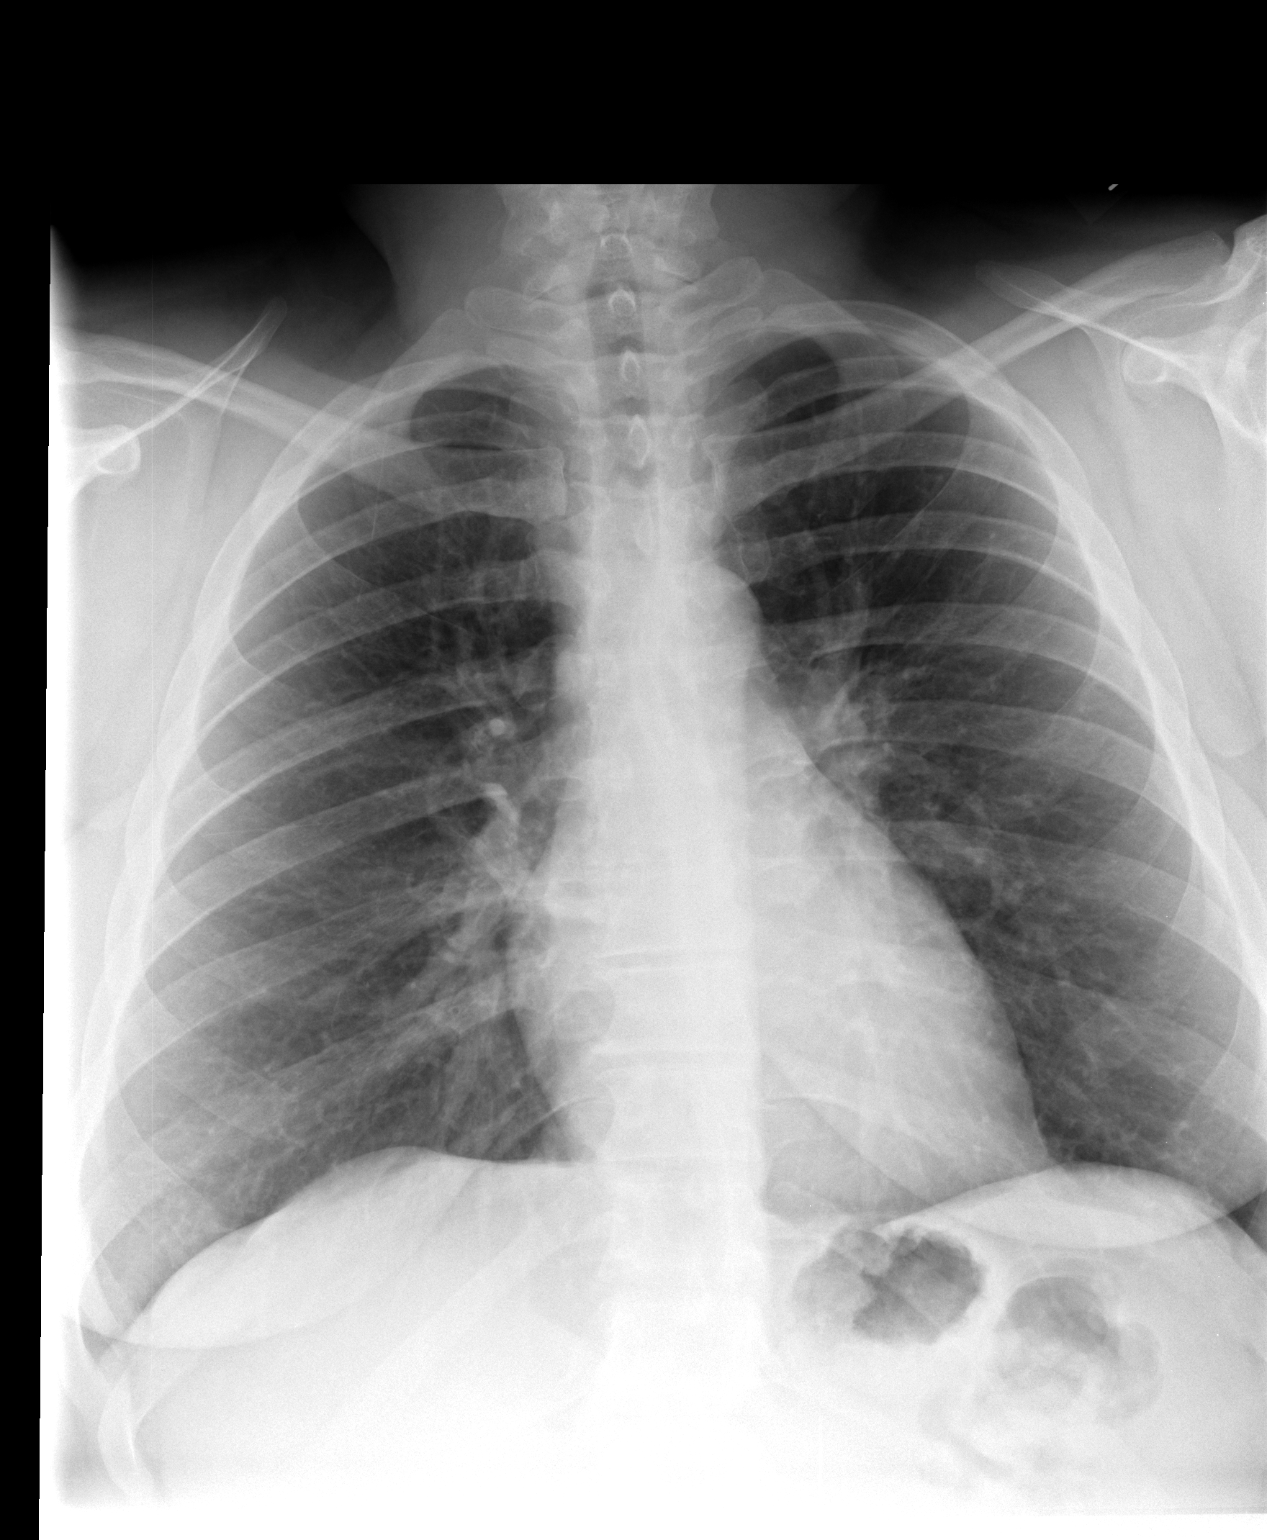

[view not recorded (2 of 3)]
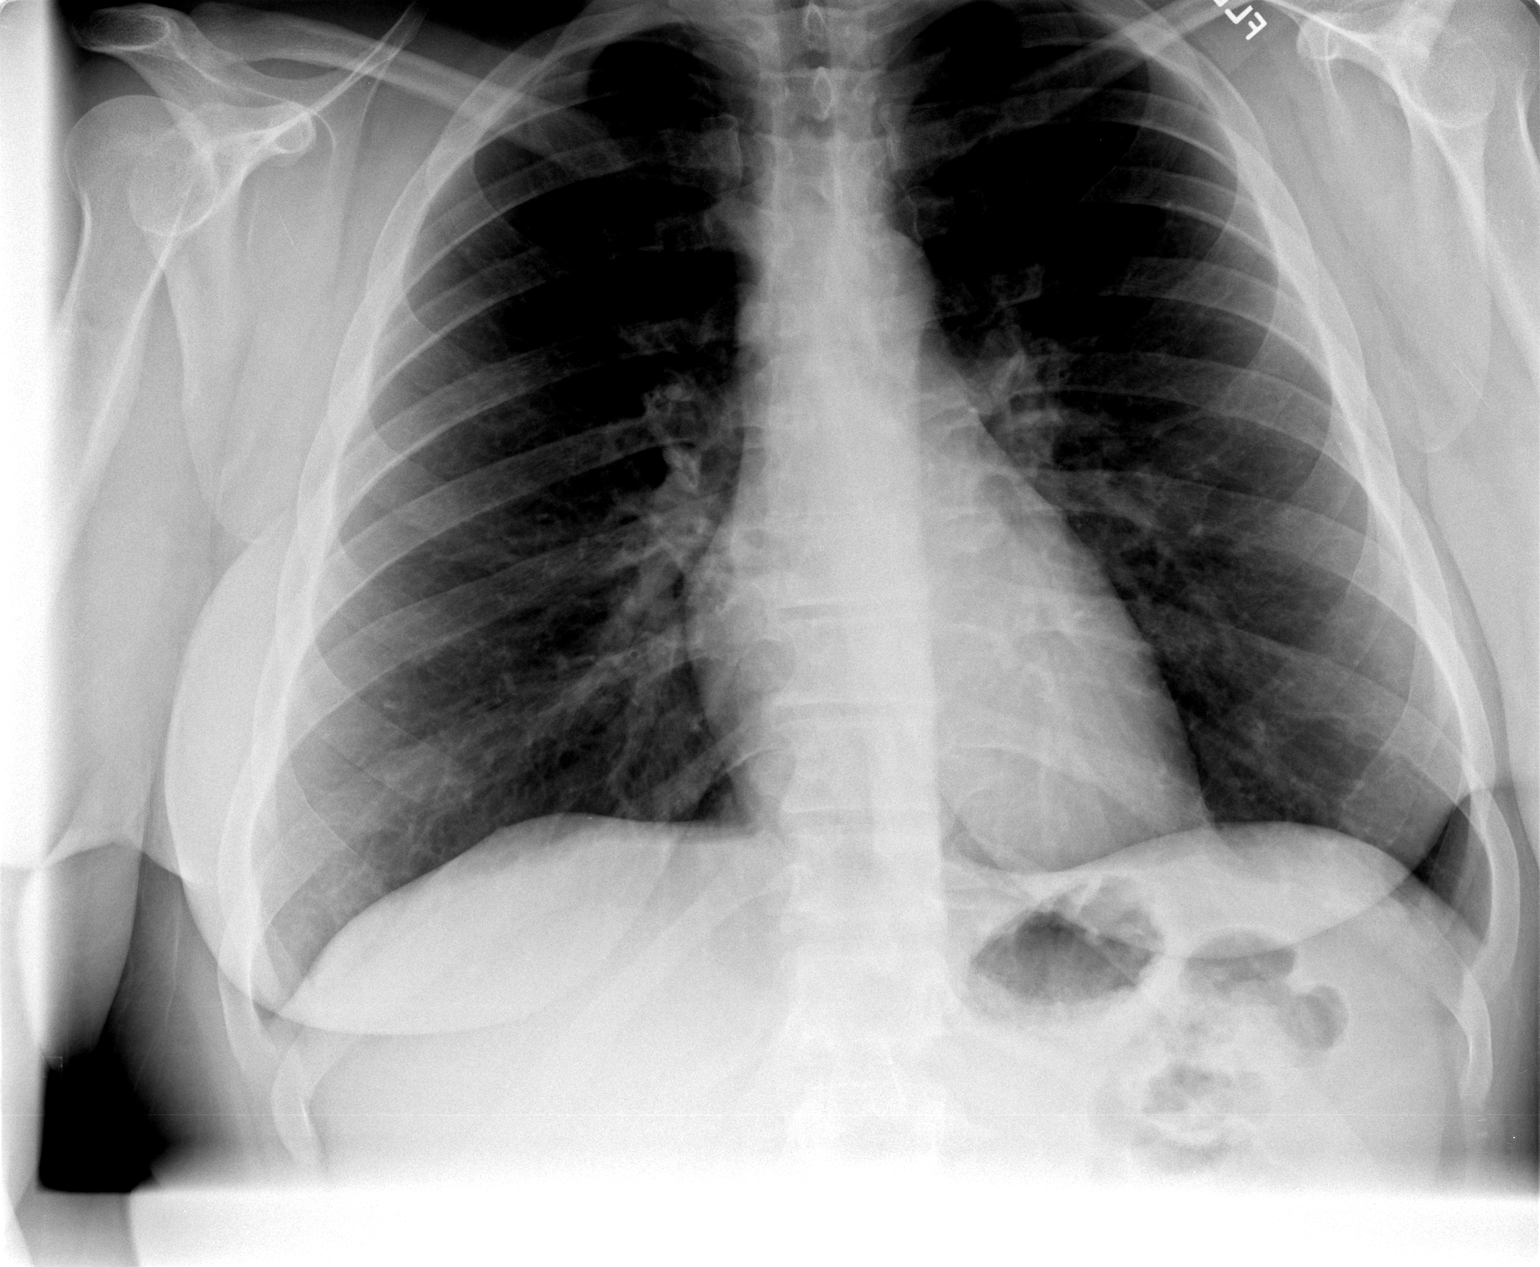

[view not recorded (3 of 3)]
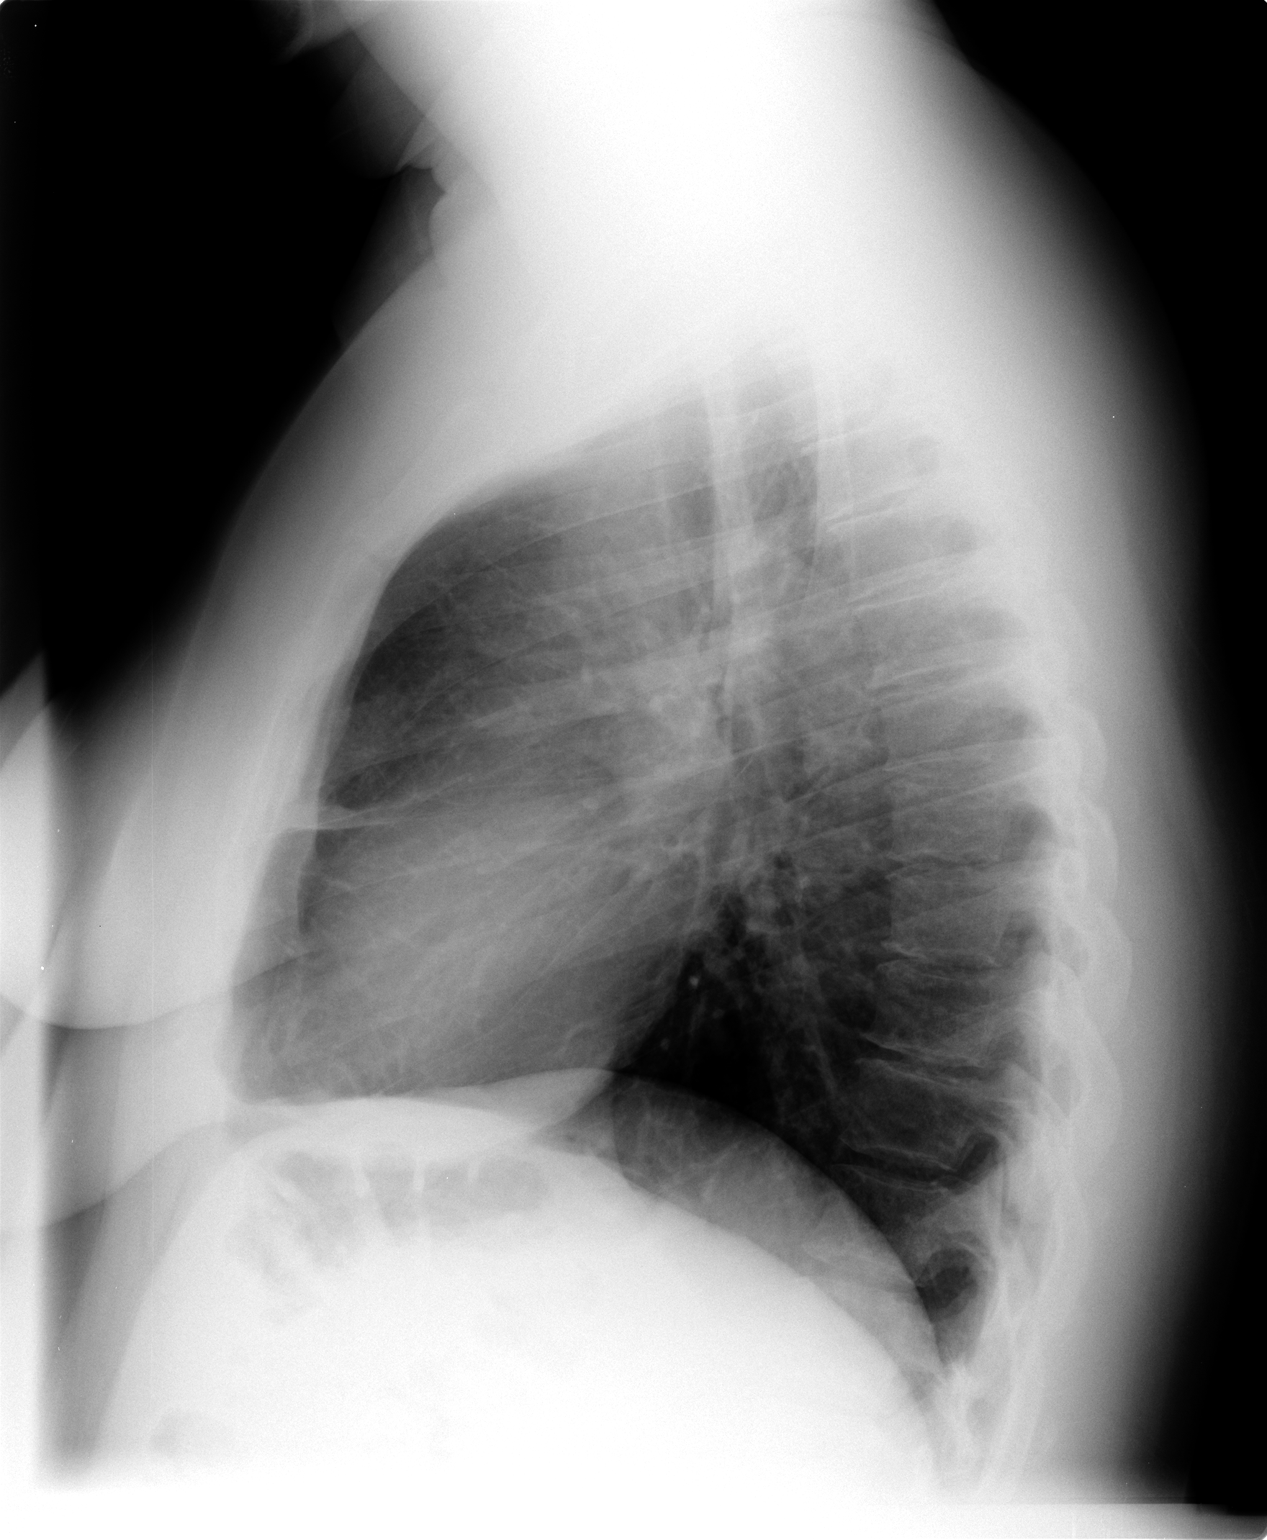

[3 of 3 positions shown; findings below may reference images not displayed]

FINDINGS: The heart size and mediastinal contours are within normal limits.
Both lungs are clear. The visualized skeletal structures are
unremarkable.
IMPRESSION: No active cardiopulmonary disease.

## 2015-06-28 DIAGNOSIS — L732 Hidradenitis suppurativa: Secondary | ICD-10-CM | POA: Insufficient documentation

## 2015-06-28 DIAGNOSIS — R7309 Other abnormal glucose: Secondary | ICD-10-CM | POA: Insufficient documentation

## 2016-06-15 DIAGNOSIS — J45909 Unspecified asthma, uncomplicated: Secondary | ICD-10-CM | POA: Insufficient documentation

## 2016-06-15 DIAGNOSIS — E119 Type 2 diabetes mellitus without complications: Secondary | ICD-10-CM | POA: Insufficient documentation

## 2016-08-12 ENCOUNTER — Emergency Department (HOSPITAL_COMMUNITY)
Admission: EM | Admit: 2016-08-12 | Discharge: 2016-08-12 | Disposition: A | Discharge status: Home / Self Care | Payer: BLUE CROSS/BLUE SHIELD | Attending: Emergency Medicine | Admitting: Emergency Medicine

## 2016-08-12 ENCOUNTER — Emergency Department (HOSPITAL_COMMUNITY): Payer: BLUE CROSS/BLUE SHIELD

## 2016-08-12 ENCOUNTER — Encounter (HOSPITAL_COMMUNITY): Payer: Self-pay | Admitting: Emergency Medicine

## 2016-08-12 DIAGNOSIS — Y998 Other external cause status: Secondary | ICD-10-CM | POA: Insufficient documentation

## 2016-08-12 DIAGNOSIS — W010XXA Fall on same level from slipping, tripping and stumbling without subsequent striking against object, initial encounter: Secondary | ICD-10-CM | POA: Insufficient documentation

## 2016-08-12 DIAGNOSIS — S99912A Unspecified injury of left ankle, initial encounter: Secondary | ICD-10-CM | POA: Diagnosis present

## 2016-08-12 DIAGNOSIS — F17210 Nicotine dependence, cigarettes, uncomplicated: Secondary | ICD-10-CM | POA: Diagnosis not present

## 2016-08-12 DIAGNOSIS — S93402A Sprain of unspecified ligament of left ankle, initial encounter: Secondary | ICD-10-CM | POA: Insufficient documentation

## 2016-08-12 DIAGNOSIS — Y9301 Activity, walking, marching and hiking: Secondary | ICD-10-CM | POA: Diagnosis not present

## 2016-08-12 DIAGNOSIS — S9032XA Contusion of left foot, initial encounter: Secondary | ICD-10-CM | POA: Diagnosis not present

## 2016-08-12 DIAGNOSIS — Y92832 Beach as the place of occurrence of the external cause: Secondary | ICD-10-CM | POA: Diagnosis not present

## 2016-08-12 MED ORDER — TRAMADOL HCL 50 MG PO TABS: 50.0000 mg | ORAL_TABLET | Freq: Four times a day (QID) | ORAL | 0 refills | Status: DC | PRN

## 2016-08-12 NOTE — ED Triage Notes (Signed)
PT states she tripped over a pull out bed and hit her left foot. PT states pain to left foot with bruising and swelling.

## 2016-08-12 NOTE — ED Provider Notes (Signed)
[facility] DEPT Provider Note   CSN: [id number] Arrival date & time: 08/12/16  1554     History   Chief Complaint Chief Complaint  Patient presents with  . Fall    HPI Valerie [name] is a 36 y.o. female presenting with left foot and ankle pain which occurred suddenly when the patient fell 2 days ago, describing [place] caught her foot on the corner, tripped and fell.  She twisted the ankle during the fall.  Pain is aching, constant and worse with palpation, movement and weight bearing.  The patient was able to weight bear immediately after the event.  There is no radiation of pain and the patient denies numbness distal to the injury site.  The patients treatment prior to arrival included ice, heat and ibuprofen.  She feels increased pain and swelling since yesterday. .  The history is provided by the patient.    Past Medical History:  Diagnosis Date  . Anxiety   . Bacterial vaginosis   . Bronchitis   . Dysrhythmia   . Gestational diabetes     There are no active problems to display for this patient.   Past Surgical History:  Procedure Laterality Date  . CESAREAN SECTION    . INDUCED ABORTION    . TONSILLECTOMY    . TUBAL LIGATION      OB History    Gravida Para Term Preterm AB Living   3 2 2   1 2    SAB TAB Ectopic Multiple Live Births     1             Home Medications    Prior to Admission medications   Medication Sig Start Date End Date Taking? Authorizing Provider  albuterol (PROVENTIL HFA;VENTOLIN HFA) 108 (90 BASE) MCG/ACT inhaler Inhale 1-2 puffs into the lungs every 6 (six) hours as needed for wheezing or shortness of breath. 06/26/14   Hope Bunnie Pion, NP  ALPRAZolam Duanne Moron) 0.5 MG tablet Take 0.5 mg by mouth 2 (two) times daily. 07/13/16   Historical Provider, MD  ibuprofen (ADVIL,MOTRIN) 200 MG tablet Take 800 mg by mouth 2 (two) times daily as needed for moderate pain.    Historical Provider,  MD  traMADol (ULTRAM) 50 MG tablet Take 1 tablet (50 mg total) by mouth every 6 (six) hours as needed. 08/12/16   Evalee Jefferson, PA-C    Family History Family History  Problem Relation Age of Onset  . Diabetes Mother   . Hypertension Mother   . Diabetes Other   . Hypertension Other     Social History Social History  Substance Use Topics  . Smoking status: Current Every Day Smoker    Packs/day: 0.50    Types: Cigarettes  . Smokeless tobacco: Never Used  . Alcohol use No     Allergies   Milk-related compounds   Review of Systems Review of Systems  Constitutional: Negative for fever.  Musculoskeletal: Positive for arthralgias and joint swelling. Negative for myalgias.  Neurological: Negative for weakness and numbness.     Physical Exam Updated Vital Signs BP 156/89 (BP Location: Left Arm)   Pulse 95   Temp 98.5 F (36.9 C) (Oral)   Resp 18   Ht 5\' 7"  (1.702 m)   Wt 102.1 kg   LMP 07/12/2016   SpO2 99%   BMI 35.24 kg/m   Physical Exam  Constitutional: She appears well-developed and well-nourished.  HENT:  Head: Normocephalic.  Cardiovascular: Normal rate and intact distal pulses.  Exam reveals no decreased pulses.   Pulses:      Dorsalis pedis pulses are 2+ on the left side.  Less than 2 sec cap refill in left toes.  Musculoskeletal: She exhibits edema and tenderness.       Left ankle: She exhibits swelling. She exhibits normal range of motion, no ecchymosis, no deformity and normal pulse. Tenderness. Lateral malleolus tenderness found. No proximal fibula tenderness found. Achilles tendon normal.       Left foot: There is bony tenderness and swelling. There is normal capillary refill, no crepitus and no deformity.       Feet:  ttp left lateral ankle and across midfoot with nonpitting edema, small bruising noted medial midfoot.   Neurological: She is alert. No sensory deficit.  Pt can flex/ext left toes and ankle with minimal pain.    Skin: Skin is warm, dry  and intact.  Nursing note and vitals reviewed.    ED Treatments / Results  Labs (all labs ordered are listed, but only abnormal results are displayed) Labs Reviewed - No data to display  EKG  EKG Interpretation None       Radiology Dg Ankle Complete Left  Result Date: 08/12/2016 CLINICAL DATA:  Left ankle pain and swelling after fall 2 days ago. EXAM: LEFT ANKLE COMPLETE - 3+ VIEW COMPARISON:  None. FINDINGS: There is no evidence of fracture, dislocation, or joint effusion. There is no evidence of arthropathy or other focal bone abnormality. Soft tissues are unremarkable. IMPRESSION: Normal left ankle. Electronically Signed   By: Marijo Conception, M.D.   On: 08/12/2016 17:00   Dg Foot Complete Left  Result Date: 08/12/2016 CLINICAL DATA:  Left foot pain and swelling after fall 2 days ago. EXAM: LEFT FOOT - COMPLETE 3+ VIEW COMPARISON:  None. FINDINGS: There is no evidence of fracture or dislocation. There is no evidence of arthropathy. Spurring of posterior calcaneus is noted. Soft tissues are unremarkable. IMPRESSION: No acute abnormality seen in the left foot. Electronically Signed   By: Marijo Conception, M.D.   On: 08/12/2016 17:00    Procedures Procedures (including critical care time)  Medications Ordered in ED Medications - No data to display   Initial Impression / Assessment and Plan / ED Course  I have reviewed the triage vital signs and the nursing notes.  Pertinent labs & imaging results that were available during my care of the patient were reviewed by me and considered in my medical decision making (see chart for details).     Imaging reviewed, negative, exam c/w ankle sprain/ contusion.  RICE, aso, continue ibuprofen.  Tramadol.  Prn f/u with pcp one week if not improving.  Final Clinical Impressions(s) / ED Diagnoses   Final diagnoses:  Sprain of left ankle, unspecified ligament, initial encounter  Contusion of left foot, initial encounter    New  Prescriptions New Prescriptions   TRAMADOL (ULTRAM) 50 MG TABLET    Take 1 tablet (50 mg total) by mouth every 6 (six) hours as needed.     Evalee Jefferson, PA-C 08/12/16 1746    Pattricia Boss, MD 08/12/16 2204

## 2016-08-12 NOTE — Discharge Instructions (Signed)
Wear the ASO provided to help support your foot and ankle.  Use ice and elevation as much as possible for the next several days to help reduce the swelling.  Continue using ibuprofen as discussed, not more than 800 mg (4 tablets) every 8 hours.  You may take the tramadol prescribed for pain relief.  This will make you drowsy - do not drive within 4 hours of taking this medication. Call your doctor for a recheck in one week if not improved. You may benefit from physical therapy if it is not getting better over the next week.

## 2016-08-12 NOTE — ED Notes (Signed)
Unable to assess. Pt on cell phone

## 2017-06-14 ENCOUNTER — Encounter (HOSPITAL_COMMUNITY): Payer: Self-pay | Admitting: Family Medicine

## 2017-06-14 ENCOUNTER — Ambulatory Visit (HOSPITAL_COMMUNITY)
Admission: EM | Admit: 2017-06-14 | Discharge: 2017-06-14 | Disposition: A | Discharge status: Home / Self Care | Payer: 59 | Attending: Urgent Care | Admitting: Urgent Care

## 2017-06-14 DIAGNOSIS — R05 Cough: Secondary | ICD-10-CM

## 2017-06-14 DIAGNOSIS — F17200 Nicotine dependence, unspecified, uncomplicated: Secondary | ICD-10-CM

## 2017-06-14 DIAGNOSIS — J3489 Other specified disorders of nose and nasal sinuses: Secondary | ICD-10-CM

## 2017-06-14 DIAGNOSIS — J9801 Acute bronchospasm: Secondary | ICD-10-CM

## 2017-06-14 DIAGNOSIS — R059 Cough, unspecified: Secondary | ICD-10-CM

## 2017-06-14 DIAGNOSIS — R0981 Nasal congestion: Secondary | ICD-10-CM

## 2017-06-14 MED ORDER — BENZONATATE 100 MG PO CAPS: 100.0000 mg | ORAL_CAPSULE | Freq: Three times a day (TID) | ORAL | 0 refills | Status: DC | PRN

## 2017-06-14 MED ORDER — DOXYCYCLINE HYCLATE 100 MG PO CAPS: 100.0000 mg | ORAL_CAPSULE | Freq: Two times a day (BID) | ORAL | 0 refills | Status: DC

## 2017-06-14 MED ORDER — PREDNISONE 20 MG PO TABS: ORAL_TABLET | ORAL | 0 refills | Status: DC

## 2017-06-14 NOTE — ED Triage Notes (Signed)
Pt here for cough, chill, headache, sinus congestion. Pt was given Augmentin last week for sinus infection.

## 2017-06-14 NOTE — Discharge Instructions (Signed)
For sore throat try using a honey-based tea. Use 3 teaspoons of honey with juice squeezed from half lemon. Place shaved pieces of ginger into 1/2-1 cup of water and warm over stove top. Then mix the ingredients and repeat every 4 hours as needed. 

## 2017-06-14 NOTE — ED Provider Notes (Signed)
MRN: [id number] DOB: April 24, 1981  Subjective:   Valerie [name] is a 37 y.o. female presenting for 4 day history of chills, dry cough, runny and stuffy nose, chest tightness in the morning, subjective fever. Has tried amoxicillin as prescribed by her PCP. Has allergies, has been managing her symptoms with benadryl. Smokes 1/2-1 ppd. Denies ear pain, chest pain, abdominal pain, rashes.   Valerie [name] is allergic to milk-related compounds.  Valerie [name]  has a past medical history of Anxiety, Bacterial vaginosis, Bronchitis, Dysrhythmia, and Gestational diabetes. Also  has a past surgical history that includes Induced abortion; Cesarean section; Tonsillectomy; and Tubal ligation. Her family history includes Diabetes in her mother and other; Hypertension in her mother and other.   Objective:   Vitals: BP (!) 137/95   Pulse (!) 102   Temp 98.5 F (36.9 C)   Resp 18   LMP 06/07/2017   SpO2 100%   Physical Exam  Constitutional: She is oriented to person, place, and time. She appears well-developed and well-nourished.  HENT:  Mouth/Throat: Oropharynx is clear and moist.  Mild bilateral maxillary sinus tenderness. Nasal turbinates pink, dry.  Eyes: Right eye exhibits no discharge. Left eye exhibits no discharge.  Neck: Normal range of motion. Neck supple.  Cardiovascular: Normal rate, regular rhythm and intact distal pulses. Exam reveals no gallop and no friction rub.  No murmur heard. Pulmonary/Chest: No respiratory distress. She has no wheezes. She has no rales.  Lymphadenopathy:    She has no cervical adenopathy.  Neurological: She is alert and oriented to person, place, and time.  Skin: Skin is warm and dry.  Psychiatric: She has a normal mood and affect.   Assessment and Plan :   Cough  Sinus congestion  Sinus pain  Tobacco use disorder  Bronchospasm   Will start short steroid course. Schedule inhaler. Maintain benadryl, hydrate aggressively. Use Tessalon for cough suppression. Hold  off on smoking. Patient provided with script for doxycycline in case she does not have improvement with aforementioned measures. Return-to-clinic precautions discussed, patient verbalized understanding.    Wallis Bamberg, PA-C 06/14/17 1250

## 2017-06-16 DIAGNOSIS — R2232 Localized swelling, mass and lump, left upper limb: Secondary | ICD-10-CM | POA: Diagnosis not present

## 2017-06-16 DIAGNOSIS — D1722 Benign lipomatous neoplasm of skin and subcutaneous tissue of left arm: Secondary | ICD-10-CM | POA: Diagnosis not present

## 2017-09-05 DIAGNOSIS — J028 Acute pharyngitis due to other specified organisms: Secondary | ICD-10-CM | POA: Diagnosis not present

## 2017-09-05 DIAGNOSIS — M791 Myalgia, unspecified site: Secondary | ICD-10-CM | POA: Diagnosis not present

## 2017-10-23 DIAGNOSIS — J4530 Mild persistent asthma, uncomplicated: Secondary | ICD-10-CM | POA: Diagnosis not present

## 2017-10-23 DIAGNOSIS — M25561 Pain in right knee: Secondary | ICD-10-CM | POA: Diagnosis not present

## 2017-11-08 ENCOUNTER — Ambulatory Visit: Payer: 59 | Admitting: Orthopaedic Surgery

## 2017-11-08 ENCOUNTER — Encounter: Payer: Self-pay | Admitting: Orthopaedic Surgery

## 2017-11-08 ENCOUNTER — Ambulatory Visit (INDEPENDENT_AMBULATORY_CARE_PROVIDER_SITE_OTHER): Payer: 59

## 2017-11-08 VITALS — BP 132/86 | HR 102 | Temp 98.1°F | Ht 67.0 in | Wt 237.0 lb

## 2017-11-08 DIAGNOSIS — G8929 Other chronic pain: Secondary | ICD-10-CM | POA: Diagnosis not present

## 2017-11-08 DIAGNOSIS — M25562 Pain in left knee: Secondary | ICD-10-CM

## 2017-11-08 DIAGNOSIS — M25561 Pain in right knee: Secondary | ICD-10-CM | POA: Diagnosis not present

## 2017-11-08 NOTE — Patient Instructions (Signed)
Knee Exercises Ask your health care provider which exercises are safe for you. Do exercises exactly as told by your health care provider and adjust them as directed. It is normal to feel mild stretching, pulling, tightness, or discomfort as you do these exercises, but you should stop right away if you feel sudden pain or your pain gets worse.Do not begin these exercises until told by your health care provider. STRETCHING AND RANGE OF MOTION EXERCISES These exercises warm up your muscles and joints and improve the movement and flexibility of your knee. These exercises also help to relieve pain, numbness, and tingling. Exercise A: Knee Extension, Prone 1. Lie on your abdomen on a bed. 2. Place your left / right knee just beyond the edge of the surface so your knee is not on the bed. You can put a towel under your left / right thigh just above your knee for comfort. 3. Relax your leg muscles and allow gravity to straighten your knee. You should feel a stretch behind your left / right knee. 4. Hold this position for __________ seconds. 5. Scoot up so your knee is supported between repetitions. Repeat __________ times. Complete this stretch __________ times a day. Exercise B: Knee Flexion, Active  1. Lie on your back with both knees straight. If this causes back discomfort, bend your left / right knee so your foot is flat on the floor. 2. Slowly slide your left / right heel back toward your buttocks until you feel a gentle stretch in the front of your knee or thigh. 3. Hold this position for __________ seconds. 4. Slowly slide your left / right heel back to the starting position. Repeat __________ times. Complete this exercise __________ times a day. Exercise C: Quadriceps, Prone  1. Lie on your abdomen on a firm surface, such as a bed or padded floor. 2. Bend your left / right knee and hold your ankle. If you cannot reach your ankle or pant leg, loop a belt around your foot and grab the belt  instead. 3. Gently pull your heel toward your buttocks. Your knee should not slide out to the side. You should feel a stretch in the front of your thigh and knee. 4. Hold this position for __________ seconds. Repeat __________ times. Complete this stretch __________ times a day. Exercise D: Hamstring, Supine 1. Lie on your back. 2. Loop a belt or towel over the ball of your left / right foot. The ball of your foot is on the walking surface, right under your toes. 3. Straighten your left / right knee and slowly pull on the belt to raise your leg until you feel a gentle stretch behind your knee. ? Do not let your left / right knee bend while you do this. ? Keep your other leg flat on the floor. 4. Hold this position for __________ seconds. Repeat __________ times. Complete this stretch __________ times a day. STRENGTHENING EXERCISES These exercises build strength and endurance in your knee. Endurance is the ability to use your muscles for a long time, even after they get tired. Exercise E: Quadriceps, Isometric  1. Lie on your back with your left / right leg extended and your other knee bent. Put a rolled towel or small pillow under your knee if told by your health care provider. 2. Slowly tense the muscles in the front of your left / right thigh. You should see your kneecap slide up toward your hip or see increased dimpling just above the knee. This   motion will push the back of the knee toward the floor. 3. For __________ seconds, keep the muscle as tight as you can without increasing your pain. 4. Relax the muscles slowly and completely. Repeat __________ times. Complete this exercise __________ times a day. Exercise F: Straight Leg Raises - Quadriceps 1. Lie on your back with your left / right leg extended and your other knee bent. 2. Tense the muscles in the front of your left / right thigh. You should see your kneecap slide up or see increased dimpling just above the knee. Your thigh may  even shake a bit. 3. Keep these muscles tight as you raise your leg 4-6 inches (10-15 cm) off the floor. Do not let your knee bend. 4. Hold this position for __________ seconds. 5. Keep these muscles tense as you lower your leg. 6. Relax your muscles slowly and completely after each repetition. Repeat __________ times. Complete this exercise __________ times a day. Exercise G: Hamstring, Isometric 1. Lie on your back on a firm surface. 2. Bend your left / right knee approximately __________ degrees. 3. Dig your left / right heel into the surface as if you are trying to pull it toward your buttocks. Tighten the muscles in the back of your thighs to dig as hard as you can without increasing any pain. 4. Hold this position for __________ seconds. 5. Release the tension gradually and allow your muscles to relax completely for __________ seconds after each repetition. Repeat __________ times. Complete this exercise __________ times a day. Exercise H: Hamstring Curls  If told by your health care provider, do this exercise while wearing ankle weights. Begin with __________ weights. Then increase the weight by 1 lb (0.5 kg) increments. Do not wear ankle weights that are more than __________. 1. Lie on your abdomen with your legs straight. 2. Bend your left / right knee as far as you can without feeling pain. Keep your hips flat against the floor. 3. Hold this position for __________ seconds. 4. Slowly lower your leg to the starting position.  Repeat __________ times. Complete this exercise __________ times a day. Exercise I: Squats (Quadriceps) 1. Stand in front of a table, with your feet and knees pointing straight ahead. You may rest your hands on the table for balance but not for support. 2. Slowly bend your knees and lower your hips like you are going to sit in a chair. ? Keep your weight over your heels, not over your toes. ? Keep your lower legs upright so they are parallel with the table  legs. ? Do not let your hips go lower than your knees. ? Do not bend lower than told by your health care provider. ? If your knee pain increases, do not bend as low. 3. Hold the squat position for __________ seconds. 4. Slowly push with your legs to return to standing. Do not use your hands to pull yourself to standing. Repeat __________ times. Complete this exercise __________ times a day. Exercise J: Wall Slides (Quadriceps)  1. Lean your back against a smooth wall or door while you walk your feet out 18-24 inches (46-61 cm) from it. 2. Place your feet hip-width apart. 3. Slowly slide down the wall or door until your knees bend __________ degrees. Keep your knees over your heels, not over your toes. Keep your knees in line with your hips. 4. Hold for __________ seconds. Repeat __________ times. Complete this exercise __________ times a day. Exercise K: Straight Leg Raises -   Hip Abductors 1. Lie on your side with your left / right leg in the top position. Lie so your head, shoulder, knee, and hip line up. You may bend your bottom knee to help you keep your balance. 2. Roll your hips slightly forward so your hips are stacked directly over each other and your left / right knee is facing forward. 3. Leading with your heel, lift your top leg 4-6 inches (10-15 cm). You should feel the muscles in your outer hip lifting. ? Do not let your foot drift forward. ? Do not let your knee roll toward the ceiling. 4. Hold this position for __________ seconds. 5. Slowly return your leg to the starting position. 6. Let your muscles relax completely after each repetition. Repeat __________ times. Complete this exercise __________ times a day. Exercise L: Straight Leg Raises - Hip Extensors 1. Lie on your abdomen on a firm surface. You can put a pillow under your hips if that is more comfortable. 2. Tense the muscles in your buttocks and lift your left / right leg about 4-6 inches (10-15 cm). Keep your knee  straight as you lift your leg. 3. Hold this position for __________ seconds. 4. Slowly lower your leg to the starting position. 5. Let your leg relax completely after each repetition. Repeat __________ times. Complete this exercise __________ times a day. This information is not intended to replace advice given to you by your health care provider. Make sure you discuss any questions you have with your health care provider. Document Released: 04/13/2005 Document Revised: 02/22/2016 Document Reviewed: 04/05/2015 Elsevier Interactive Patient Education  2018 [employer]  Steps to Quit Smoking Smoking tobacco can be bad for your health. It can also affect almost every organ in your body. Smoking puts you and people around you at risk for many serious long-lasting (chronic) diseases. Quitting smoking is hard, but it is one of the best things that you can do for your health. It is never too late to quit. What are the benefits of quitting smoking? When you quit smoking, you lower your risk for getting serious diseases and conditions. They can include:  Lung cancer or lung disease.  Heart disease.  Stroke.  Heart attack.  Not being able to have children (infertility).  Weak bones (osteoporosis) and broken bones (fractures).  If you have coughing, wheezing, and shortness of breath, those symptoms may get better when you quit. You may also get sick less often. If you are pregnant, quitting smoking can help to lower your chances of having a baby of low birth weight. What can I do to help me quit smoking? Talk with your doctor about what can help you quit smoking. Some things you can do (strategies) include:  Quitting smoking totally, instead of slowly cutting back how much you smoke over a period of time.  Going to in-person counseling. You are more likely to quit if you go to many counseling sessions.  Using resources and support systems, such as: ? Online chats with a counselor. ? Phone  quitlines. ? Printed self-help materials. ? Support groups or group counseling. ? Text messaging programs. ? Mobile phone apps or applications.  Taking medicines. Some of these medicines may have nicotine in them. If you are pregnant or breastfeeding, do not take any medicines to quit smoking unless your doctor says it is okay. Talk with your doctor about counseling or other things that can help you.  Talk with your doctor about using more than one strategy   at the same time, such as taking medicines while you are also going to in-person counseling. This can help make quitting easier. What things can I do to make it easier to quit? Quitting smoking might feel very hard at first, but there is a lot that you can do to make it easier. Take these steps:  Talk to your family and friends. Ask them to support and encourage you.  Call phone quitlines, reach out to support groups, or work with a counselor.  Ask people who smoke to not smoke around you.  Avoid places that make you want (trigger) to smoke, such as: ? Bars. ? Parties. ? Smoke-break areas at work.  Spend time with people who do not smoke.  Lower the stress in your life. Stress can make you want to smoke. Try these things to help your stress: ? Getting regular exercise. ? Deep-breathing exercises. ? Yoga. ? Meditating. ? Doing a body scan. To do this, close your eyes, focus on one area of your body at a time from head to toe, and notice which parts of your body are tense. Try to relax the muscles in those areas.  Download or buy apps on your mobile phone or tablet that can help you stick to your quit plan. There are many free apps, such as QuitGuide from the CDC (Centers for Disease Control and Prevention). You can find more support from smokefree.gov and other websites.  This information is not intended to replace advice given to you by your health care provider. Make sure you discuss any questions you have with your health care  provider. Document Released: 03/26/2009 Document Revised: 01/26/2016 Document Reviewed: 10/14/2014 Elsevier Interactive Patient Education  2018 Elsevier Inc.  

## 2017-11-08 NOTE — Progress Notes (Signed)
Subjective:    Patient ID: Valerie [name], female    DOB: [birth year], 37 y.o.   MRN: [id number]  HPI She has had right knee pain for about 11 to 12 years off and on.  She had an injury then.  She has popping and swelling of the knee with occasional giving way.  She has no new trauma.  She has more pain after a long days work and then getting up from a seated position.  She has no new trauma.  She has been on ibuprofen 800 tid which helps some.  She has been seen at [facility] in [place].  I have copies of their notes and of X-rays and a CD xray to review.  I reviewed them.  She also has been having left knee pain as well.  It is not as bad as the right knee.  She has no giving way on the left as on the right.    Review of Systems  Constitutional: Positive for activity change.  Respiratory: Negative for cough and shortness of breath.   Cardiovascular: Negative for chest pain and leg swelling.  Endocrine: Positive for cold intolerance.  Musculoskeletal: Positive for arthralgias, gait problem and joint swelling.  Allergic/Immunologic: Positive for environmental allergies.  Psychiatric/Behavioral: The patient is nervous/anxious.    Past Medical History:  Diagnosis Date  . Anxiety   . Bacterial vaginosis   . Bronchitis   . Dysrhythmia   . Gestational diabetes     Past Surgical History:  Procedure Laterality Date  . CESAREAN SECTION    . INDUCED ABORTION    . TONSILLECTOMY    . TUBAL LIGATION      Current Outpatient Medications on File Prior to Visit  Medication Sig Dispense Refill  . ALPRAZolam (XANAX) 0.5 MG tablet Take 0.5 mg by mouth 2 (two) times daily.  5  . ibuprofen (ADVIL,MOTRIN) 800 MG tablet Take 800 mg by mouth every 8 (eight) hours as needed.  2  . metoprolol succinate (TOPROL-XL) 25 MG 24 hr tablet Take 25 mg by mouth daily.  6  . albuterol (PROVENTIL HFA;VENTOLIN HFA) 108 (90 BASE) MCG/ACT inhaler Inhale 1-2 puffs into the lungs every 6 (six) hours as needed for  wheezing or shortness of breath. (Patient not taking: Reported on 11/08/2017) 1 Inhaler 0   No current facility-administered medications on file prior to visit.     Social History   Socioeconomic History  . Marital status: Single    Spouse name: Not on file  . Number of children: Not on file  . Years of education: Not on file  . Highest education level: Not on file  Occupational History  . Not on file  Social Needs  . Financial resource strain: Not on file  . Food insecurity:    Worry: Not on file    Inability: Not on file  . Transportation needs:    Medical: Not on file    Non-medical: Not on file  Tobacco Use  . Smoking status: Current Every Day Smoker    Packs/day: 0.50    Types: Cigarettes  . Smokeless tobacco: Never Used  Substance and Sexual Activity  . Alcohol use: No  . Drug use: No  . Sexual activity: Never    Birth control/protection: Surgical  Lifestyle  . Physical activity:    Days per week: Not on file    Minutes per session: Not on file  . Stress: Not on file  Relationships  . Social connections:  Talks on phone: Not on file    Gets together: Not on file    Attends religious service: Not on file    Active member of club or organization: Not on file    Attends meetings of clubs or organizations: Not on file    Relationship status: Not on file  . Intimate partner violence:    Fear of current or ex partner: Not on file    Emotionally abused: Not on file    Physically abused: Not on file    Forced sexual activity: Not on file  Other Topics Concern  . Not on file  Social History Narrative  . Not on file    Family History  Problem Relation Age of Onset  . Diabetes Mother   . Hypertension Mother   . Diabetes Other   . Hypertension Other     BP 132/86   Pulse (!) 102   Temp 98.1 F (36.7 C)   Ht 5\' 7"  (1.702 m)   Wt 237 lb (107.5 kg)   BMI 37.12 kg/m      Objective:   Physical Exam  Constitutional: She is oriented to person, place,  and time. She appears well-developed and well-nourished.  HENT:  Head: Normocephalic and atraumatic.  Eyes: Pupils are equal, round, and reactive to light. Conjunctivae and EOM are normal.  Neck: Normal range of motion. Neck supple.  Cardiovascular: Normal rate, regular rhythm and intact distal pulses.  Pulmonary/Chest: Effort normal.  Abdominal: Soft.  Musculoskeletal:       Right knee: She exhibits decreased range of motion and effusion. Tenderness found. Medial joint line tenderness noted.       Left knee: She exhibits swelling. Tenderness found. Medial joint line tenderness noted.       Legs: Neurological: She is alert and oriented to person, place, and time. She has normal reflexes. She displays normal reflexes. No cranial nerve deficit. She exhibits normal muscle tone. Coordination normal.  Skin: Skin is warm and dry.  Psychiatric: She has a normal mood and affect. Her behavior is normal. Judgment and thought content normal.    X-rays of the left knee were done, reported separately.      Assessment & Plan:   Encounter Diagnoses  Name Primary?  . Chronic pain of right knee   . Chronic pain of left knee Yes   PROCEDURE NOTE:  The patient requests injections of the right knee , verbal consent was obtained.  The right knee was prepped appropriately after time out was performed.   Sterile technique was observed and injection of 1 cc of Depo-Medrol 40 mg with several cc's of plain xylocaine. Anesthesia was provided by ethyl chloride and a 20-gauge needle was used to inject the knee area. The injection was tolerated well.  A band aid dressing was applied.  The patient was advised to apply ice later today and tomorrow to the injection sight as needed.  I am concerned about a medial meniscus tear of the right knee.  I feel she needs a MRI of the right knee.  She has to wait another month for insurance reasons.  Return in one month.  Continue present medicine.  Call if any  problem.  Precautions discussed.   Electronically Signed Darreld Mclean, MD 5/29/20193:09 PM

## 2017-12-06 ENCOUNTER — Ambulatory Visit: Payer: 59 | Admitting: Orthopaedic Surgery

## 2017-12-19 ENCOUNTER — Ambulatory Visit (HOSPITAL_COMMUNITY)
Admission: EM | Admit: 2017-12-19 | Discharge: 2017-12-19 | Disposition: A | Discharge status: Home / Self Care | Payer: 59 | Attending: Family Medicine | Admitting: Family Medicine

## 2017-12-19 ENCOUNTER — Other Ambulatory Visit: Payer: Self-pay

## 2017-12-19 ENCOUNTER — Encounter (HOSPITAL_COMMUNITY): Payer: Self-pay | Admitting: *Deleted

## 2017-12-19 DIAGNOSIS — J40 Bronchitis, not specified as acute or chronic: Secondary | ICD-10-CM | POA: Diagnosis not present

## 2017-12-19 MED ORDER — PREDNISONE 50 MG PO TABS: 50.0000 mg | ORAL_TABLET | Freq: Every day | ORAL | 0 refills | Status: AC

## 2017-12-19 NOTE — Discharge Instructions (Signed)
Start prednisone for bronchitis.  Continue albuterol as needed for shortness of breath and wheezing. Keep hydrated, your urine should be clear to pale yellow in color. Tylenol/motrin for fever and pain. Monitor for any worsening of symptoms, chest pain, shortness of breath, wheezing, swelling of the throat, follow up for reevaluation.

## 2017-12-19 NOTE — ED Provider Notes (Signed)
[facility]    CSN: [id number] Arrival date & time: 12/19/17  1122     History   Chief Complaint Chief Complaint  Patient presents with  . Cough    HPI Valerie [name] is a 37 y.o. female.   37 year old female comes in for 2-day history of cough, chest congestion, shortness of breath.  Patient states she gets bronchitis often due to smoking, and symptoms are similar to that.  Had some wheezing.  Denies rhinorrhea, nasal congestion.  Denies fever, chills, night sweats.  Has been using albuterol with temporary relief.  Current everyday smoker.     Past Medical History:  Diagnosis Date  . Anxiety   . Bacterial vaginosis   . Bronchitis   . Dysrhythmia   . Gestational diabetes     There are no active problems to display for this patient.   Past Surgical History:  Procedure Laterality Date  . CESAREAN SECTION    . INDUCED ABORTION    . TONSILLECTOMY    . TUBAL LIGATION      OB History    Gravida  3   Para  2   Term  2   Preterm      AB  1   Living  2     SAB      TAB  1   Ectopic      Multiple      Live Births               Home Medications    Prior to Admission medications   Medication Sig Start Date End Date Taking? Authorizing Provider  albuterol (PROVENTIL HFA;VENTOLIN HFA) 108 (90 BASE) MCG/ACT inhaler Inhale 1-2 puffs into the lungs every 6 (six) hours as needed for wheezing or shortness of breath. Patient not taking: Reported on 11/08/2017 06/26/14   Ashley Murrain, NP  ALPRAZolam Duanne Moron) 0.5 MG tablet Take 0.5 mg by mouth 2 (two) times daily. 07/13/16   [provider]  ibuprofen (ADVIL,MOTRIN) 800 MG tablet Take 800 mg by mouth every 8 (eight) hours as needed. 10/30/17   [provider]  metoprolol succinate (TOPROL-XL) 25 MG 24 hr tablet Take 25 mg by mouth daily. 10/12/17   [provider]  predniSONE (DELTASONE) 50 MG tablet Take 1 tablet (50 mg total) by mouth daily for 5 days. 12/19/17 12/24/17   Ok Edwards, PA-C    Family History Family History  Problem Relation Age of Onset  . Diabetes Mother   . Hypertension Mother   . Diabetes Other   . Hypertension Other     Social History Social History   Tobacco Use  . Smoking status: Current Every Day Smoker    Packs/day: 0.50    Types: Cigarettes  . Smokeless tobacco: Never Used  Substance Use Topics  . Alcohol use: No  . Drug use: No     Allergies   Milk-related compounds   Review of Systems Review of Systems  Reason unable to perform ROS: See HPI as above.     Physical Exam Triage Vital Signs ED Triage Vitals  Enc Vitals Group     BP 12/19/17 1148 (!) 145/89     Pulse Rate 12/19/17 1148 94     Resp 12/19/17 1148 16     Temp 12/19/17 1148 98.6 F (37 C)     Temp Source 12/19/17 1148 Oral     SpO2 12/19/17 1148 94 %     Weight --  Height --      Head Circumference --      Peak Flow --      Pain Score 12/19/17 1149 3     Pain Loc --      Pain Edu? --      Excl. in [place]? --    No data found.  Updated Vital Signs BP (!) 145/89 (BP Location: Right Arm)   Pulse 94   Temp 98.6 F (37 C) (Oral)   Resp 16   LMP 12/19/2017 (Exact Date)   SpO2 94%   Physical Exam  Constitutional: She is oriented to person, place, and time. She appears well-developed and well-nourished. No distress.  HENT:  Head: Normocephalic and atraumatic.  Right Ear: Tympanic membrane, external ear and ear canal normal. Tympanic membrane is not erythematous and not bulging.  Left Ear: Tympanic membrane, external ear and ear canal normal. Tympanic membrane is not erythematous and not bulging.  Nose: Nose normal. Right sinus exhibits no maxillary sinus tenderness and no frontal sinus tenderness. Left sinus exhibits no maxillary sinus tenderness and no frontal sinus tenderness.  Mouth/Throat: Uvula is midline, oropharynx is clear and moist and mucous membranes are normal.  Eyes: Pupils are equal, round, and reactive to light.  Conjunctivae and EOM are normal.  Neck: Normal range of motion. Neck supple.  Cardiovascular: Normal rate, regular rhythm and normal heart sounds. Exam reveals no gallop and no friction rub.  No murmur heard. Pulmonary/Chest: Effort normal and breath sounds normal. She has no decreased breath sounds. She has no wheezes. She has no rhonchi. She has no rales.  Lymphadenopathy:    She has no cervical adenopathy.  Neurological: She is alert and oriented to person, place, and time.  Skin: Skin is warm and dry.  Psychiatric: She has a normal mood and affect. Her behavior is normal. Judgment normal.    UC Treatments / Results  Labs (all labs ordered are listed, but only abnormal results are displayed) Labs Reviewed - No data to display  EKG None  Radiology No results found.  Procedures Procedures (including critical care time)  Medications Ordered in UC Medications - No data to display  Initial Impression / Assessment and Plan / UC Course  I have reviewed the triage vital signs and the nursing notes.  Pertinent labs & imaging results that were available during my care of the patient were reviewed by me and considered in my medical decision making (see chart for details).  Clinical Course as of Dec 19 1252  Tue Dec 19, 2017  1252 Repeat O2 sat 97-98% at room air   [AY]    Clinical Course User Index [AY] Ok Edwards, PA-C   Start prednisone for bronchitis.  Continue albuterol.  Push fluids. Smoking cessation discussed.  Return precautions given.  Patient expresses understanding and agrees to plan.  Final Clinical Impressions(s) / UC Diagnoses   Final diagnoses:  Bronchitis    ED Prescriptions    Medication Sig Dispense Auth. Provider   predniSONE (DELTASONE) 50 MG tablet Take 1 tablet (50 mg total) by mouth daily for 5 days. 5 tablet Tobin Chad, PA-C 12/19/17 1254

## 2017-12-19 NOTE — ED Triage Notes (Signed)
C/o cough and chest congestion onset yest.

## 2017-12-23 DIAGNOSIS — J209 Acute bronchitis, unspecified: Secondary | ICD-10-CM | POA: Diagnosis not present

## 2017-12-25 DIAGNOSIS — Z79899 Other long term (current) drug therapy: Secondary | ICD-10-CM | POA: Diagnosis not present

## 2017-12-25 DIAGNOSIS — F17200 Nicotine dependence, unspecified, uncomplicated: Secondary | ICD-10-CM | POA: Diagnosis not present

## 2017-12-25 DIAGNOSIS — R0689 Other abnormalities of breathing: Secondary | ICD-10-CM | POA: Diagnosis not present

## 2017-12-25 DIAGNOSIS — I471 Supraventricular tachycardia: Secondary | ICD-10-CM | POA: Diagnosis not present

## 2017-12-25 DIAGNOSIS — R42 Dizziness and giddiness: Secondary | ICD-10-CM | POA: Diagnosis not present

## 2017-12-26 DIAGNOSIS — R000 Tachycardia, unspecified: Secondary | ICD-10-CM | POA: Diagnosis not present

## 2017-12-26 DIAGNOSIS — R739 Hyperglycemia, unspecified: Secondary | ICD-10-CM | POA: Diagnosis not present

## 2017-12-26 DIAGNOSIS — J209 Acute bronchitis, unspecified: Secondary | ICD-10-CM | POA: Diagnosis not present

## 2017-12-27 DIAGNOSIS — Z720 Tobacco use: Secondary | ICD-10-CM | POA: Diagnosis not present

## 2017-12-27 DIAGNOSIS — E1165 Type 2 diabetes mellitus with hyperglycemia: Secondary | ICD-10-CM | POA: Diagnosis not present

## 2018-01-22 ENCOUNTER — Ambulatory Visit: Payer: 59 | Admitting: Cardiology

## 2018-01-22 ENCOUNTER — Encounter: Payer: Self-pay | Admitting: Cardiology

## 2018-01-22 VITALS — BP 132/94 | HR 77 | Ht 62.0 in | Wt 236.2 lb

## 2018-01-22 DIAGNOSIS — R000 Tachycardia, unspecified: Secondary | ICD-10-CM

## 2018-01-22 DIAGNOSIS — R002 Palpitations: Secondary | ICD-10-CM | POA: Diagnosis not present

## 2018-01-22 MED ORDER — METOPROLOL SUCCINATE ER 25 MG PO TB24: 25.0000 mg | ORAL_TABLET | Freq: Every day | ORAL | 6 refills | Status: DC

## 2018-01-22 NOTE — Progress Notes (Signed)
Clinical Summary Valerie [name] is a 37 y.o.female seen as new consult, referred by PA Kayleen Memos for palpitations.   1. Palpitations - ongoing x 10 years - feeling of heart racing, weak, lightheaded, diaphoretic. Can occur at any time. Lasts 15-30 minutes. Occurs about 1-2 times per month. No specific triggers. More severe episodes recently. - episode early July called ambulance. Told by EMS it was SVT, heart rates to 180s. Was given ER IV medicine and heart rate go too low.   - coffee x 3 cups, rare tea, diet dr pepper small bottles x 2, no energy drinks, rare EtOH - takes Toprol 25mg  in AM x 1 year. Just recently increased to additional 12.5mg  at night.    Past Medical History:  Diagnosis Date  . Anxiety   . Bacterial vaginosis   . Bronchitis   . Dysrhythmia   . Gestational diabetes      Allergies  Allergen Reactions  . Milk-Related Compounds Itching    Throat itch and lips tingle      Current Outpatient Medications  Medication Sig Dispense Refill  . albuterol (PROVENTIL HFA;VENTOLIN HFA) 108 (90 BASE) MCG/ACT inhaler Inhale 1-2 puffs into the lungs every 6 (six) hours as needed for wheezing or shortness of breath. (Patient not taking: Reported on 11/08/2017) 1 Inhaler 0  . ALPRAZolam (XANAX) 0.5 MG tablet Take 0.5 mg by mouth 2 (two) times daily.  5  . ibuprofen (ADVIL,MOTRIN) 800 MG tablet Take 800 mg by mouth every 8 (eight) hours as needed.  2  . metoprolol succinate (TOPROL-XL) 25 MG 24 hr tablet Take 25 mg by mouth daily.  6   No current facility-administered medications for this visit.      Past Surgical History:  Procedure Laterality Date  . CESAREAN SECTION    . INDUCED ABORTION    . TONSILLECTOMY    . TUBAL LIGATION       Allergies  Allergen Reactions  . Milk-Related Compounds Itching    Throat itch and lips tingle       Family History  Problem Relation Age of Onset  . Diabetes Mother   . Hypertension Mother   . Diabetes Other   .  Hypertension Other      Social History Valerie [name] reports that she has been smoking cigarettes. She has been smoking about 0.50 packs per day. She has never used smokeless tobacco. Valerie [name] reports that she does not drink alcohol.   Review of Systems CONSTITUTIONAL: No weight loss, fever, chills, weakness or fatigue.  HEENT: Eyes: No visual loss, blurred vision, double vision or yellow sclerae.No hearing loss, sneezing, congestion, runny nose or sore throat.  SKIN: No rash or itching.  CARDIOVASCULAR: per hpi RESPIRATORY: No shortness of breath, cough or sputum.  GASTROINTESTINAL: No anorexia, nausea, vomiting or diarrhea. No abdominal pain or blood.  GENITOURINARY: No burning on urination, no polyuria NEUROLOGICAL: No headache, dizziness, syncope, paralysis, ataxia, numbness or tingling in the extremities. No change in bowel or bladder control.  MUSCULOSKELETAL: No muscle, back pain, joint pain or stiffness.  LYMPHATICS: No enlarged nodes. No history of splenectomy.  PSYCHIATRIC: No history of depression or anxiety.  ENDOCRINOLOGIC: No reports of sweating, cold or heat intolerance. No polyuria or polydipsia.  Marland Kitchen   Physical Examination Vitals:   01/22/18 1441  BP: (!) 132/94  Pulse: 77  SpO2: 98%   Vitals:   01/22/18 1441  Weight: 236 lb 3.2 oz (107.1 kg)  Height: 5\' 2"  (1.575 m)  Gen: resting comfortably, no acute distress HEENT: no scleral icterus, pupils equal round and reactive, no palptable cervical adenopathy,  CV: RRR, no m/r/g, no jvd Resp: Clear to auscultation bilaterally GI: abdomen is soft, non-tender, non-distended, normal bowel sounds, no hepatosplenomegaly MSK: extremities are warm, no edema.  Skin: warm, no rash Neuro:  no focal deficits Psych: appropriate affect     Assessment and Plan  1. Palpitations/Tachycardia - by available history sounds like PSVT, we are awaiting records and EKG from [facility] - no recurrent symptoms since  increasing her Toprol. Continue current therapy, discussed weaning caffeien as well, discussed vagal maneuvers - we discussed option of ablation, at this time elected to continue medical therapy.  - EKG today shows NSR  F/u records to confirm diagnosis of SVT.    F/u 6 months   Arnoldo Lenis, M.D.

## 2018-01-22 NOTE — Patient Instructions (Signed)
Medication Instructions:  Your physician recommends that you continue on your current medications as directed. Please refer to the Current Medication list given to you today.  Labwork: NONE  Testing/Procedures: NONE  Follow-Up: Your physician wants you to follow-up in: 6 MONTHS WITH DR. BRANCH. You will receive a reminder letter in the mail two months in advance. If you don't receive a letter, please call our office to schedule the follow-up appointment.  Any Other Special Instructions Will Be Listed Below (If Applicable).  If you need a refill on your cardiac medications before your next appointment, please call your pharmacy. 

## 2018-01-24 ENCOUNTER — Telehealth: Payer: Self-pay | Admitting: Cardiology

## 2018-01-24 NOTE — Telephone Encounter (Signed)
Can we obtain all the EKGs available from [facility], the one we received is only normal sinus rhythm. If they have one documenting the SVT then next step would be to refer to Dr Rayann Heman  Carlyle Dolly MD

## 2018-01-24 NOTE — Telephone Encounter (Signed)
After discussing with family patient feels she may want to proceed to having an ablation.  Would like for someone to contact her in reference to this

## 2018-01-24 NOTE — Telephone Encounter (Signed)
Records requested and pt made aware of status

## 2018-01-24 NOTE — Telephone Encounter (Signed)
Pt wants to go ahead with ablation - looks like for SVT and pending records from [facility] - will confirm with provider

## 2018-02-05 ENCOUNTER — Telehealth: Payer: Self-pay | Admitting: *Deleted

## 2018-02-05 DIAGNOSIS — R002 Palpitations: Secondary | ICD-10-CM

## 2018-02-05 NOTE — Telephone Encounter (Signed)
LM to return call.

## 2018-02-05 NOTE — Telephone Encounter (Signed)
Did we ever get any additional EKGs from [place]. That was the hold up previously, the one they sent [place] was only normal sinus rhythm. If that's all they have I don't think they captured the rhythm by EKG. We would need to have a documented rhythm to help Dr Rayann Heman. Is she still having any symptoms, we may have to have her wear a heart monitor   J Kaiyana Bedore MD

## 2018-02-05 NOTE — Telephone Encounter (Signed)
[facility] only had EKG with NSR and is scanned into Epic - pt says she was in SVT per EMS when they arrived at her home but there is no documentation of this

## 2018-02-05 NOTE — Telephone Encounter (Signed)
Pt wants to be referred to Dr Rayann Heman for ablation - says she has been sore in her chest and concerned and anxious - denies any pain/SOB/palpitations

## 2018-02-05 NOTE — Telephone Encounter (Signed)
Let her know I have sent Dr Rayann Heman a message to see if he would go ahead and see her or if we need to arrange a home heart monitor first to try and capture the arrhythmia since it wasn't captured during her admission, though by history it does sound like an SVT. Does she know if anywhere else may have captured this on EKG before?. We will be back in touch with her.    Zandra Abts MD

## 2018-02-06 NOTE — Telephone Encounter (Signed)
Pt says palpitations and symptoms vary several times monthly - says over the last year the intensity of her symptoms has worsened lasting around 15-30 mins at a time - order a 30 day event monitor?

## 2018-02-06 NOTE — Telephone Encounter (Signed)
Spoke with Dr Rayann Heman, would be best to capture arrhythmia prior to referral. If no other ER or hospital visits that may have captured it then we will need to obtain a monitor. How long she needs to wear it depends on how frequent her symptoms are. Have her describe specifically how often she is having palpitations   Carlyle Dolly MD

## 2018-02-07 NOTE — Telephone Encounter (Signed)
Yes please order a 30 day event monitor for palpitations   J Ivry Pigue MD

## 2018-02-07 NOTE — Telephone Encounter (Signed)
Pt aware and agreeable - enrolled pt

## 2018-02-16 DIAGNOSIS — R002 Palpitations: Secondary | ICD-10-CM | POA: Diagnosis not present

## 2018-03-08 ENCOUNTER — Telehealth: Payer: Self-pay | Admitting: *Deleted

## 2018-03-08 DIAGNOSIS — I471 Supraventricular tachycardia: Secondary | ICD-10-CM

## 2018-03-08 NOTE — Telephone Encounter (Signed)
[employer] called regarding pt in SVT with HR between 161-167 and 1 PVC - faxing report - and have left pt message to return call - also [employer] was unable to reach pt

## 2018-03-08 NOTE — Telephone Encounter (Signed)
Pt says about 830 this morning c/o palpitations not lasting long and took 1/2 tablet of metoprolol - says she went to work like normal came home , took a nap since she was feeling weak and tired - woke up and is feeling better - pt aware that if symptoms worsen she would need ED evaluation and would forward to provider

## 2018-03-09 NOTE — Telephone Encounter (Signed)
Can increaes AM dose to 37.5mg  in AM and 25mg  in pm

## 2018-03-09 NOTE — Telephone Encounter (Signed)
Pt says she has already been taking Toprol XL 25 mg bid for the last month - referral placed for Dr Rayann Heman and will forward to schedulers

## 2018-03-09 NOTE — Telephone Encounter (Signed)
Tracings show SVT, can she increase her Toprol to 25mg  bid. Can we go ahead and refer her to Dr Rayann Heman for SVT   Zandra Abts MD

## 2018-03-12 NOTE — Telephone Encounter (Signed)
LM to return call.

## 2018-03-12 NOTE — Telephone Encounter (Signed)
Ibuprofen will not affect her abnormal heart rhythms, so ok to keep taking from that standpoint   Zandra Abts MD

## 2018-03-12 NOTE — Telephone Encounter (Signed)
Pt voiced understanding - updated medication list - wanted to know if it was safe for her to continue to take ibuprofen 800mg  bid as given by her pcp for her feet and leg pain

## 2018-03-12 NOTE — Telephone Encounter (Signed)
Pt aware.

## 2018-03-12 NOTE — Addendum Note (Signed)
Addended by: Julian Hy T on: 03/12/2018 09:21 AM   Modules accepted: Orders

## 2018-03-26 DIAGNOSIS — S62616A Displaced fracture of proximal phalanx of right little finger, initial encounter for closed fracture: Secondary | ICD-10-CM | POA: Diagnosis not present

## 2018-03-26 DIAGNOSIS — Z79899 Other long term (current) drug therapy: Secondary | ICD-10-CM | POA: Diagnosis not present

## 2018-03-26 DIAGNOSIS — S92511A Displaced fracture of proximal phalanx of right lesser toe(s), initial encounter for closed fracture: Secondary | ICD-10-CM | POA: Diagnosis not present

## 2018-03-26 DIAGNOSIS — L02212 Cutaneous abscess of back [any part, except buttock]: Secondary | ICD-10-CM | POA: Diagnosis not present

## 2018-03-26 DIAGNOSIS — W228XXA Striking against or struck by other objects, initial encounter: Secondary | ICD-10-CM | POA: Diagnosis not present

## 2018-03-27 ENCOUNTER — Ambulatory Visit (INDEPENDENT_AMBULATORY_CARE_PROVIDER_SITE_OTHER): Payer: 59 | Admitting: Orthopaedic Surgery

## 2018-03-27 ENCOUNTER — Encounter: Payer: Self-pay | Admitting: Orthopaedic Surgery

## 2018-03-27 VITALS — BP 138/93 | HR 94 | Ht 67.0 in | Wt 236.0 lb

## 2018-03-27 DIAGNOSIS — S92514A Nondisplaced fracture of proximal phalanx of right lesser toe(s), initial encounter for closed fracture: Secondary | ICD-10-CM | POA: Diagnosis not present

## 2018-03-27 MED ORDER — HYDROCODONE-ACETAMINOPHEN 5-325 MG PO TABS: ORAL_TABLET | ORAL | 0 refills | Status: DC

## 2018-03-27 NOTE — Progress Notes (Signed)
Patient Valerie [name], female DOB:Feb 05, 1981, 37 y.o. ZSW:[id number]  Chief Complaint  Patient presents with  . Fracture    Right foot 5th digit    HPI  Valerie [name] is a 37 y.o. female who hurt her little toe on 03-26-18.  She caught it on a piece of furniture.  She was seen at [facility] and had x-rays showing a fracture of the distal portion of the proximal phalanx of the little toe medially.  She was given a post op shoe. She is a little better.  She has pain still. She cannot work with open toe shoe.   Body mass index is 36.96 kg/m.  ROS  Review of Systems  Constitutional: Positive for activity change.  Respiratory: Negative for cough and shortness of breath.   Cardiovascular: Negative for chest pain and leg swelling.  Endocrine: Positive for cold intolerance.  Musculoskeletal: Positive for arthralgias, gait problem and joint swelling.  Allergic/Immunologic: Positive for environmental allergies.  Psychiatric/Behavioral: The patient is nervous/anxious.     All other systems reviewed and are negative.  The following is a summary of the past history medically, past history surgically, known current medicines, social history and family history.  This information is gathered electronically by the computer from prior information and documentation.  I review this each visit and have found including this information at this point in the chart is beneficial and informative.    Past Medical History:  Diagnosis Date  . Anxiety   . Bacterial vaginosis   . Bronchitis   . Dysrhythmia   . Gestational diabetes     Past Surgical History:  Procedure Laterality Date  . CESAREAN SECTION    . INDUCED ABORTION    . TONSILLECTOMY    . TUBAL LIGATION      Family History  Problem Relation Age of Onset  . Diabetes Mother   . Hypertension Mother   . Diabetes Other   . Hypertension Other     Social History Social History   Tobacco Use  . Smoking status:  Current Every Day Smoker    Packs/day: 0.50    Types: Cigarettes  . Smokeless tobacco: Never Used  . Tobacco comment: almost 1 pack daily  Substance Use Topics  . Alcohol use: No  . Drug use: No    Allergies  Allergen Reactions  . Milk-Related Compounds Itching    Throat itch and lips tingle     Current Outpatient Medications  Medication Sig Dispense Refill  . albuterol (PROVENTIL HFA;VENTOLIN HFA) 108 (90 BASE) MCG/ACT inhaler Inhale 1-2 puffs into the lungs every 6 (six) hours as needed for wheezing or shortness of breath. 1 Inhaler 0  . ALPRAZolam (XANAX) 0.5 MG tablet Take 0.5 mg by mouth 2 (two) times daily.  5  . HYDROcodone-acetaminophen (NORCO/VICODIN) 5-325 MG tablet One tablet every four hours as needed for acute pain.  Limit of five days per [place] statue. 30 tablet 0  . ibuprofen (ADVIL,MOTRIN) 800 MG tablet Take 800 mg by mouth every 8 (eight) hours as needed.  2  . metFORMIN (GLUCOPHAGE-XR) 500 MG 24 hr tablet Take 500 mg by mouth 2 (two) times daily.    . metoprolol succinate (TOPROL-XL) 25 MG 24 hr tablet Take by mouth. TAKE 1.5 IN THE MORNING AND 1 TABLET IN THE EVENING     No current facility-administered medications for this visit.      Physical Exam  Blood pressure (!) 138/93, pulse 94, height 5\' 7"  (1.702 m),  weight 236 lb (107 kg).  Constitutional: overall normal hygiene, normal nutrition, well developed, normal grooming, normal body habitus. Assistive device:post op shoe right  Musculoskeletal: gait and station Limp right, muscle tone and strength are normal, no tremors or atrophy is present.  .  Neurological: coordination overall normal.  Deep tendon reflex/nerve stretch intact.  Sensation normal.  Cranial nerves II-XII intact.   Skin:   Normal overall no scars, lesions, ulcers or rashes. No psoriasis.  Psychiatric: Alert and oriented x 3.  Recent memory intact, remote memory unclear.  Normal mood and affect. Well groomed.  Good eye  contact.  Cardiovascular: overall no swelling, no varicosities, no edema bilaterally, normal temperatures of the legs and arms, no clubbing, cyanosis and good capillary refill.  Lymphatic: palpation is normal.  Right little toe is swollen, NV intact, ROM decreased, slight ecchymosis present.  All other systems reviewed and are negative   The patient has been educated about the nature of the problem(s) and counseled on treatment options.  The patient appeared to understand what I have discussed and is in agreement with it.  Encounter Diagnosis  Name Primary?  . Closed nondisplaced fracture of proximal phalanx of lesser toe of right foot, initial encounter Yes   I have reviewed the x-rays and ER report from [facility] PLAN Call if any problems.  Precautions discussed.  Continue current medications.   Return to clinic 2 weeks   X-rays on return.  I have reviewed the [place] Controlled Substance Reporting System web site prior to prescribing narcotic medicine for this patient.   Electronically Signed Darreld Mclean, MD 10/15/20193:06 PM

## 2018-03-29 ENCOUNTER — Telehealth: Payer: Self-pay | Admitting: *Deleted

## 2018-03-29 NOTE — Telephone Encounter (Signed)
-----   Message from Arnoldo Lenis, MD sent at 03/28/2018 11:08 AM EDT ----- Final heart monitor does show episodes of SVT, she has already been referred to EP and we will see what there recs are  J BrancH MD

## 2018-03-29 NOTE — Telephone Encounter (Signed)
Pt aware and voiced understanding - routed to pcp  

## 2018-04-05 ENCOUNTER — Telehealth: Payer: Self-pay | Admitting: Orthopaedic Surgery

## 2018-04-05 ENCOUNTER — Other Ambulatory Visit: Payer: Self-pay | Admitting: Orthopedic Surgery

## 2018-04-05 MED ORDER — HYDROCODONE-ACETAMINOPHEN 5-325 MG PO TABS: ORAL_TABLET | ORAL | 0 refills | Status: DC

## 2018-04-05 NOTE — Telephone Encounter (Signed)
Dr. Brooke Bonito patient  Hydrocodone-Acetaminophen  5/325 mg  Qty  30 Tablets  One tablet every four hours as needed for acute pain. Limit of five days per [place] statue.   PATIENT USES [facility]

## 2018-04-05 NOTE — Telephone Encounter (Signed)
Dr. Brooke Bonito patient hurt her little toe on 03-26-18.   Hydrocodone-Acetaminophen  5/325 mg  Qty  30 Tablets  One tablet every four hours as needed for acute pain. Limit of five days per [place] statue.   PATIENT USES [facility]

## 2018-04-11 ENCOUNTER — Encounter: Payer: Self-pay | Admitting: Orthopaedic Surgery

## 2018-04-11 ENCOUNTER — Ambulatory Visit (INDEPENDENT_AMBULATORY_CARE_PROVIDER_SITE_OTHER): Payer: 59

## 2018-04-11 ENCOUNTER — Ambulatory Visit: Payer: 59 | Admitting: Orthopaedic Surgery

## 2018-04-11 VITALS — BP 156/96 | HR 89 | Ht 67.0 in | Wt 241.0 lb

## 2018-04-11 DIAGNOSIS — S92514D Nondisplaced fracture of proximal phalanx of right lesser toe(s), subsequent encounter for fracture with routine healing: Secondary | ICD-10-CM

## 2018-04-11 NOTE — Patient Instructions (Signed)
Out of work another two weeks.

## 2018-04-11 NOTE — Progress Notes (Signed)
CC:  My toe is not hurting as much  She is wearing a post op shoe.  She has little pain.  She has pain when she stands a lot. 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  NV intact.  No redness is present.  Encounter Diagnosis  Name Primary?  . Closed nondisplaced fracture of proximal phalanx of lesser toe of right foot with routine healing, subsequent encounter Yes   X-rays were done, reported separately.  Return in three weeks.  Go to a regular shoe when able.  Call if any problem.  Precautions discussed.   Electronically Signed Sanjuana Kava, MD 10/30/20198:54 AM

## 2018-04-12 ENCOUNTER — Telehealth: Payer: Self-pay | Admitting: Orthopaedic Surgery

## 2018-04-12 NOTE — Telephone Encounter (Signed)
Patient called following office visit yesterday, 04/11/18; states she did not request a refill for her pain medicine - states she will run out of it over weekend. Aware Dr [name] Glasgow will return to clinic Tuesday, 04/17/18.  Medication:   HYDROcodone-acetaminophen (NORCO/VICODIN) 5-325 MG tablet   [facility]

## 2018-04-16 ENCOUNTER — Encounter: Payer: Self-pay | Admitting: Orthopedic Surgery

## 2018-04-17 NOTE — Telephone Encounter (Signed)
No further narcotics. 

## 2018-04-18 ENCOUNTER — Institutional Professional Consult (permissible substitution): Payer: 59 | Admitting: Internal Medicine

## 2018-04-30 ENCOUNTER — Telehealth: Payer: Self-pay | Admitting: Cardiology

## 2018-04-30 MED ORDER — METOPROLOL SUCCINATE ER 25 MG PO TB24: ORAL_TABLET | ORAL | 1 refills | Status: DC

## 2018-04-30 NOTE — Telephone Encounter (Signed)
metoprolol succinate (TOPROL-XL) 25 MG 24 hr tablet   Needs to talk about dosage  Take by mouth. TAKE 1.5 IN THE MORNING AND 1 TABLET IN THE EVENING - Oral She has been taking an additional pill when needed per Dr branch and runs out of medication before time to refill  Please call patient to verify how she is taking it  Wants sent to [facility]

## 2018-04-30 NOTE — Telephone Encounter (Signed)
Medication sent to pharmacy  

## 2018-04-30 NOTE — Telephone Encounter (Signed)
Ok to refill Rx  Zandra Abts MD

## 2018-04-30 NOTE — Telephone Encounter (Signed)
Pt is taking Toprol XL 25 mg 1.5 tablets bid and takes an extra 25 mg as needed for her SVT episodes - needs new rx sent to [facility] only has 1 tablet left - will confirm with Dr Harl Bowie if ok to send rx (pt had to cx appt with Dr Rayann Heman her car is broke down and will call to reschedule when she has a ride)

## 2018-05-02 ENCOUNTER — Encounter: Payer: Self-pay | Admitting: Orthopaedic Surgery

## 2018-05-02 ENCOUNTER — Ambulatory Visit (INDEPENDENT_AMBULATORY_CARE_PROVIDER_SITE_OTHER): Payer: 59

## 2018-05-02 ENCOUNTER — Ambulatory Visit (INDEPENDENT_AMBULATORY_CARE_PROVIDER_SITE_OTHER): Payer: 59 | Admitting: Orthopaedic Surgery

## 2018-05-02 VITALS — BP 140/88 | HR 94 | Ht 67.0 in | Wt 240.0 lb

## 2018-05-02 DIAGNOSIS — S92514D Nondisplaced fracture of proximal phalanx of right lesser toe(s), subsequent encounter for fracture with routine healing: Secondary | ICD-10-CM | POA: Diagnosis not present

## 2018-05-02 NOTE — Patient Instructions (Signed)
Return to work Monday, November 25

## 2018-05-02 NOTE — Progress Notes (Signed)
CC:  My toe is a little sore  She has fracture of the right little toe.  She has some swelling at times. She has no new trauma.  NV intact.  X-rays were done, reported separately.  Encounter Diagnosis  Name Primary?  . Closed nondisplaced fracture of proximal phalanx of lesser toe of right foot with routine healing, subsequent encounter Yes   She can return to work full duty Monday, November 25.  Discharge.  Call if any problem.  Precautions discussed.   Electronically Signed Sanjuana Kava, MD 11/20/20199:53 AM

## 2018-05-11 DIAGNOSIS — Z6838 Body mass index (BMI) 38.0-38.9, adult: Secondary | ICD-10-CM | POA: Diagnosis not present

## 2018-05-11 DIAGNOSIS — E1165 Type 2 diabetes mellitus with hyperglycemia: Secondary | ICD-10-CM | POA: Diagnosis not present

## 2018-05-11 DIAGNOSIS — B373 Candidiasis of vulva and vagina: Secondary | ICD-10-CM | POA: Diagnosis not present

## 2018-05-11 DIAGNOSIS — Z720 Tobacco use: Secondary | ICD-10-CM | POA: Diagnosis not present

## 2018-05-21 ENCOUNTER — Ambulatory Visit: Payer: 59 | Admitting: Orthopedic Surgery

## 2018-06-19 DIAGNOSIS — L720 Epidermal cyst: Secondary | ICD-10-CM | POA: Diagnosis not present

## 2018-06-19 DIAGNOSIS — Z6839 Body mass index (BMI) 39.0-39.9, adult: Secondary | ICD-10-CM | POA: Diagnosis not present

## 2018-06-21 DIAGNOSIS — R102 Pelvic and perineal pain: Secondary | ICD-10-CM | POA: Diagnosis not present

## 2018-07-04 DIAGNOSIS — N946 Dysmenorrhea, unspecified: Secondary | ICD-10-CM | POA: Diagnosis not present

## 2018-07-04 DIAGNOSIS — R102 Pelvic and perineal pain: Secondary | ICD-10-CM | POA: Diagnosis not present

## 2018-07-10 DIAGNOSIS — J0100 Acute maxillary sinusitis, unspecified: Secondary | ICD-10-CM | POA: Diagnosis not present

## 2018-07-10 DIAGNOSIS — Z6839 Body mass index (BMI) 39.0-39.9, adult: Secondary | ICD-10-CM | POA: Diagnosis not present

## 2018-07-10 DIAGNOSIS — R05 Cough: Secondary | ICD-10-CM | POA: Diagnosis not present

## 2018-08-02 DIAGNOSIS — N946 Dysmenorrhea, unspecified: Secondary | ICD-10-CM | POA: Diagnosis not present

## 2018-08-02 DIAGNOSIS — N941 Unspecified dyspareunia: Secondary | ICD-10-CM | POA: Diagnosis not present

## 2018-08-02 DIAGNOSIS — E119 Type 2 diabetes mellitus without complications: Secondary | ICD-10-CM | POA: Diagnosis not present

## 2018-08-02 DIAGNOSIS — G8929 Other chronic pain: Secondary | ICD-10-CM | POA: Diagnosis not present

## 2018-08-02 DIAGNOSIS — D251 Intramural leiomyoma of uterus: Secondary | ICD-10-CM | POA: Diagnosis not present

## 2018-08-02 DIAGNOSIS — R102 Pelvic and perineal pain: Secondary | ICD-10-CM | POA: Diagnosis not present

## 2018-08-02 DIAGNOSIS — D252 Subserosal leiomyoma of uterus: Secondary | ICD-10-CM | POA: Diagnosis not present

## 2018-08-02 DIAGNOSIS — D259 Leiomyoma of uterus, unspecified: Secondary | ICD-10-CM | POA: Diagnosis not present

## 2018-08-02 DIAGNOSIS — I10 Essential (primary) hypertension: Secondary | ICD-10-CM | POA: Diagnosis not present

## 2018-08-10 DIAGNOSIS — T8140XA Infection following a procedure, unspecified, initial encounter: Secondary | ICD-10-CM | POA: Diagnosis not present

## 2018-08-10 DIAGNOSIS — I10 Essential (primary) hypertension: Secondary | ICD-10-CM | POA: Diagnosis not present

## 2018-08-10 DIAGNOSIS — Z90710 Acquired absence of both cervix and uterus: Secondary | ICD-10-CM | POA: Diagnosis not present

## 2018-08-10 DIAGNOSIS — G8918 Other acute postprocedural pain: Secondary | ICD-10-CM | POA: Diagnosis not present

## 2018-08-10 DIAGNOSIS — T8141XA Infection following a procedure, superficial incisional surgical site, initial encounter: Secondary | ICD-10-CM | POA: Diagnosis not present

## 2018-08-22 DIAGNOSIS — Z720 Tobacco use: Secondary | ICD-10-CM | POA: Diagnosis not present

## 2018-08-22 DIAGNOSIS — E1165 Type 2 diabetes mellitus with hyperglycemia: Secondary | ICD-10-CM | POA: Diagnosis not present

## 2018-09-13 DIAGNOSIS — E1165 Type 2 diabetes mellitus with hyperglycemia: Secondary | ICD-10-CM | POA: Diagnosis not present

## 2018-09-17 ENCOUNTER — Other Ambulatory Visit: Payer: Self-pay | Admitting: Cardiology

## 2018-09-17 MED ORDER — METOPROLOL SUCCINATE ER 25 MG PO TB24: ORAL_TABLET | ORAL | 1 refills | Status: DC

## 2018-09-17 NOTE — Telephone Encounter (Signed)
°*  STAT* If patient is at the pharmacy, call can be transferred to refill team.   1. Which medications need to be refilled?metoprolol succinate (TOPROL-XL) 25 MG 24 hr tablet    2. Which pharmacy/location (including street and city if local pharmacy) is medication to be sent to? [facility]   3. Do they need a 30 day or 90 day supply?

## 2018-09-17 NOTE — Telephone Encounter (Signed)
Medication sent to pharmacy  

## 2018-10-02 DIAGNOSIS — J209 Acute bronchitis, unspecified: Secondary | ICD-10-CM | POA: Diagnosis not present

## 2018-10-02 DIAGNOSIS — J019 Acute sinusitis, unspecified: Secondary | ICD-10-CM | POA: Diagnosis not present

## 2018-10-02 DIAGNOSIS — Z6838 Body mass index (BMI) 38.0-38.9, adult: Secondary | ICD-10-CM | POA: Diagnosis not present

## 2018-10-25 DIAGNOSIS — R6883 Chills (without fever): Secondary | ICD-10-CM | POA: Diagnosis not present

## 2018-10-25 DIAGNOSIS — B349 Viral infection, unspecified: Secondary | ICD-10-CM | POA: Diagnosis not present

## 2018-10-25 DIAGNOSIS — Z20828 Contact with and (suspected) exposure to other viral communicable diseases: Secondary | ICD-10-CM | POA: Diagnosis not present

## 2018-10-25 DIAGNOSIS — R079 Chest pain, unspecified: Secondary | ICD-10-CM | POA: Diagnosis not present

## 2018-10-25 DIAGNOSIS — Z79899 Other long term (current) drug therapy: Secondary | ICD-10-CM | POA: Diagnosis not present

## 2018-10-26 DIAGNOSIS — J069 Acute upper respiratory infection, unspecified: Secondary | ICD-10-CM | POA: Diagnosis not present

## 2018-11-08 ENCOUNTER — Telehealth: Payer: Self-pay

## 2018-11-08 NOTE — Telephone Encounter (Signed)
Follow up  ° ° °Pt is returning call  ° ° °Please call back  °

## 2018-11-08 NOTE — Telephone Encounter (Signed)
Left message regarding appt on 11/09/18.

## 2018-11-08 NOTE — Telephone Encounter (Signed)
Spoke with pt regarding appt on 11/09/18. Pt was advise to check vitals prior to appt. Pt concerns were address.

## 2018-11-09 ENCOUNTER — Encounter: Payer: Self-pay | Admitting: Internal Medicine

## 2018-11-09 ENCOUNTER — Telehealth (INDEPENDENT_AMBULATORY_CARE_PROVIDER_SITE_OTHER): Payer: 59 | Admitting: Internal Medicine

## 2018-11-09 VITALS — Ht 67.0 in | Wt 245.0 lb

## 2018-11-09 DIAGNOSIS — I471 Supraventricular tachycardia: Secondary | ICD-10-CM

## 2018-11-09 NOTE — H&P (View-Only) (Signed)
Electrophysiology TeleHealth Note   Due to national recommendations of social distancing due to COVID 19, Audio/video telehealth visit is felt to be most appropriate for this patient at this time.  See MyChart message from today for patient consent regarding telehealth for [facility].   Date:  11/09/2018   ID:  Valerie [name], [place] Jun 27, 1980, MRN [id number]  Location: home  Provider location: [address], [place] Evaluation Performed: New patient consult  PCP:  [name], PA-C  Cardiologist:  Dr Wyline Mood Electrophysiologist:  None   Chief Complaint:  SVT  History of Present Illness:    Valerie [name] is a 38 y.o. female who presents via Web designer for a telehealth visit today.   The patient is referred for new consultation regarding SVT by Dr Wyline Mood.  She reports episodes of abrupt onset/offset of tachypalpitations.  She feels that symptoms are getting worse.  She has had 3 episodes over the past 3 weeks.  She is unaware of triggers/ precipitants.  She has woke from sleep with episodes.   She has tried metoprolol, without relief.  She reports that episodes typically last about 15-30 minutes.  She feels washed out afterwards.  She had the episode documented 12/2017 by EMS.  Valsalva maneuvers have not been helpful.  She has not received adenosine. She has tried caffeine reduction. She wore a 30 day monitor 02/2018 which shows short RP SVT at 169bpm  Today, she denies symptoms of palpitations, chest pain, shortness of breath, orthopnea, PND, lower extremity edema, claudication, dizziness, presyncope, syncope, bleeding, or neurologic sequela. The patient is tolerating medications without difficulties and is otherwise without complaint today.   she denies symptoms of cough, fevers, chills, or new SOB worrisome for COVID 19.   Past Medical History:  Diagnosis Date  . Anxiety   . Bacterial vaginosis   . Bronchitis   . Diabetes (HCC)   .  Overweight   . SVT (supraventricular tachycardia) (HCC)     Past Surgical History:  Procedure Laterality Date  . CESAREAN SECTION    . INDUCED ABORTION    . TONSILLECTOMY    . TUBAL LIGATION      Current Outpatient Medications  Medication Sig Dispense Refill  . acetaminophen (TYLENOL) 500 MG tablet Take 500 mg by mouth every 6 (six) hours as needed for moderate pain.    Marland Kitchen albuterol (PROVENTIL HFA;VENTOLIN HFA) 108 (90 BASE) MCG/ACT inhaler Inhale 1-2 puffs into the lungs every 6 (six) hours as needed for wheezing or shortness of breath. 1 Inhaler 0  . ALPRAZolam (XANAX) 0.5 MG tablet Take 0.5 mg by mouth 2 (two) times daily.  5  . metFORMIN (GLUCOPHAGE-XR) 500 MG 24 hr tablet Take 500 mg by mouth 3 (three) times daily.     . metoprolol succinate (TOPROL-XL) 25 MG 24 hr tablet Take 50 mg by mouth 2 (two) times a day.     No current facility-administered medications for this visit.     Allergies:   Milk-related compounds   Social History:  The patient  reports that she has been smoking cigarettes. She has been smoking about 0.50 packs per day. She has never used smokeless tobacco. She reports current alcohol use. She reports that she does not use drugs.   Family History:  The patient's family history includes Diabetes in her mother and another family member; Hypertension in her mother and another family member.    ROS:  Please see the history of present illness.  All other systems are personally reviewed and negative.    Exam:    Vital Signs:  Ht 5\' 7"  (1.702 m)   Wt 245 lb (111.1 kg)   BMI 38.37 kg/m   Well appearing, alert and conversant, regular work of breathing,  good skin color Eyes- anicteric, neuro- grossly intact, skin- no apparent rash or lesions or cyanosis, mouth- oral mucosa is pink   Labs/Other Tests and Data Reviewed:    Recent Labs: No results found for requested labs within last 8760 hours.   Wt Readings from Last 3 Encounters:  11/09/18 245 lb (111.1  kg)  05/02/18 240 lb (108.9 kg)  04/11/18 241 lb (109.3 kg)     Other studies personally reviewed: Additional studies/ records that were reviewed today include: Dr Verna Czech notes, recent event monitor  Review of the above records today demonstrates: as above  ekg 01/22/2018, normal ekg Notes from 12/2017 ER suggest (junctional rhythm at 139 bpm) which may be short RP SVT similar to seen on event monitor   ASSESSMENT & PLAN:    1.  SVT Short RP SVT documented Therapeutic strategies for supraventricular tachycardia including medicine and ablation were discussed in detail with the patient today. Risk, benefits, and alternatives to EP study and radiofrequency ablation were also discussed in detail today. These risks include but are not limited to stroke, bleeding, vascular damage, tamponade, perforation, damage to the heart and other structures, AV block requiring pacemaker, worsening renal function, and death. The patient understands these risk and wishes to proceed.  We will therefore proceed with catheter ablation at the next available time. Carto/ICE/Anesthesia requested for this procedure.  2. COVID screen The patient does not have any symptoms that suggest any further testing/ screening at this time.  Social distancing reinforced today.  Current medicines are reviewed at length with the patient today.   The patient does not have concerns regarding her medicines.  The following changes were made today:  none  Labs/ tests ordered today include:  No orders of the defined types were placed in this encounter.  Patient Risk:  after full review of this patients clinical status, I feel that they are at moderate risk at this time.  Today, I have spent 22 minutes with the patient with telehealth technology discussing SVT .    Signed, Hillis Range MD, [facility] 11/09/2018 12:51 PM   [facility] [address] [place] [address] [phone] (office)  [phone] (fax)

## 2018-11-09 NOTE — Progress Notes (Signed)
Electrophysiology TeleHealth Note   Due to national recommendations of social distancing due to COVID 19, Audio/video telehealth visit is felt to be most appropriate for this patient at this time.  See MyChart message from today for patient consent regarding telehealth for [facility].   Date:  11/09/2018   ID:  Valerie [name], Valerie [name] 15, 1982, MRN [id number]  Location: home  Provider location: [address], [place] Evaluation Performed: New patient consult  PCP:  Valerie Moris, PA-C  Cardiologist:  Dr Wyline Mood Electrophysiologist:  None   Chief Complaint:  SVT  History of Present Illness:    Valerie [name] is a 38 y.o. female who presents via Web designer for a telehealth visit today.   The patient is referred for new consultation regarding SVT by Dr Wyline Mood.  She reports episodes of abrupt onset/offset of tachypalpitations.  She feels that symptoms are getting worse.  She has had 3 episodes over the past 3 weeks.  She is unaware of triggers/ precipitants.  She has woke from sleep with episodes.   She has tried metoprolol, without relief.  She reports that episodes typically last about 15-30 minutes.  She feels washed out afterwards.  She had the episode documented 12/2017 by EMS.  Valsalva maneuvers have not been helpful.  She has not received adenosine. She has tried caffeine reduction. She wore a 30 day monitor 02/2018 which shows short RP SVT at 169bpm  Today, she denies symptoms of palpitations, chest pain, shortness of breath, orthopnea, PND, lower extremity edema, claudication, dizziness, presyncope, syncope, bleeding, or neurologic sequela. The patient is tolerating medications without difficulties and is otherwise without complaint today.   she denies symptoms of cough, fevers, chills, or new SOB worrisome for COVID 19.   Past Medical History:  Diagnosis Date  . Anxiety   . Bacterial vaginosis   . Bronchitis   . Diabetes (HCC)   .  Overweight   . SVT (supraventricular tachycardia) (HCC)     Past Surgical History:  Procedure Laterality Date  . CESAREAN SECTION    . INDUCED ABORTION    . TONSILLECTOMY    . TUBAL LIGATION      Current Outpatient Medications  Medication Sig Dispense Refill  . acetaminophen (TYLENOL) 500 MG tablet Take 500 mg by mouth every 6 (six) hours as needed for moderate pain.    Marland Kitchen albuterol (PROVENTIL HFA;VENTOLIN HFA) 108 (90 BASE) MCG/ACT inhaler Inhale 1-2 puffs into the lungs every 6 (six) hours as needed for wheezing or shortness of breath. 1 Inhaler 0  . ALPRAZolam (XANAX) 0.5 MG tablet Take 0.5 mg by mouth 2 (two) times daily.  5  . metFORMIN (GLUCOPHAGE-XR) 500 MG 24 hr tablet Take 500 mg by mouth 3 (three) times daily.     . metoprolol succinate (TOPROL-XL) 25 MG 24 hr tablet Take 50 mg by mouth 2 (two) times a day.     No current facility-administered medications for this visit.     Allergies:   Milk-related compounds   Social History:  The patient  reports that she has been smoking cigarettes. She has been smoking about 0.50 packs per day. She has never used smokeless tobacco. She reports current alcohol use. She reports that she does not use drugs.   Family History:  The patient's family history includes Diabetes in her mother and another family member; Hypertension in her mother and another family member.    ROS:  Please see the history of present illness.  All other systems are personally reviewed and negative.    Exam:    Vital Signs:  Ht 5\' 7"  (1.702 m)   Wt 245 lb (111.1 kg)   BMI 38.37 kg/m   Well appearing, alert and conversant, regular work of breathing,  good skin color Eyes- anicteric, neuro- grossly intact, skin- no apparent rash or lesions or cyanosis, mouth- oral mucosa is pink   Labs/Other Tests and Data Reviewed:    Recent Labs: No results found for requested labs within last 8760 hours.   Wt Readings from Last 3 Encounters:  11/09/18 245 lb (111.1  kg)  05/02/18 240 lb (108.9 kg)  04/11/18 241 lb (109.3 kg)     Other studies personally reviewed: Additional studies/ records that were reviewed today include: Dr Valerie [name] notes, recent event monitor  Review of the above records today demonstrates: as above  ekg 01/22/2018, normal ekg Notes from 12/2017 ER suggest (junctional rhythm at 139 bpm) which may be short RP SVT similar to seen on event monitor   ASSESSMENT & PLAN:    1.  SVT Short RP SVT documented Therapeutic strategies for supraventricular tachycardia including medicine and ablation were discussed in detail with the patient today. Risk, benefits, and alternatives to EP study and radiofrequency ablation were also discussed in detail today. These risks include but are not limited to stroke, bleeding, vascular damage, tamponade, perforation, damage to the heart and other structures, AV block requiring pacemaker, worsening renal function, and death. The patient understands these risk and wishes to proceed.  We will therefore proceed with catheter ablation at the next available time. Carto/ICE/Anesthesia requested for this procedure.  2. COVID screen The patient does not have any symptoms that suggest any further testing/ screening at this time.  Social distancing reinforced today.  Current medicines are reviewed at length with the patient today.   The patient does not have concerns regarding her medicines.  The following changes were made today:  none  Labs/ tests ordered today include:  No orders of the defined types were placed in this encounter.  Patient Risk:  after full review of this patients clinical status, I feel that they are at moderate risk at this time.  Today, I have spent 22 minutes with the patient with telehealth technology discussing SVT .    Signed, Hillis Range MD, [facility] 11/09/2018 12:51 PM   [facility] [address] [place] [address] [phone] (office)  [phone] (fax)

## 2018-11-13 ENCOUNTER — Telehealth: Payer: Self-pay | Admitting: Internal Medicine

## 2018-11-13 NOTE — Telephone Encounter (Signed)
FMLA/disability form placed in Dr. Jackalyn Lombard box for signature. 11/13/18 vlm

## 2018-11-26 ENCOUNTER — Telehealth: Payer: Self-pay

## 2018-11-26 DIAGNOSIS — I471 Supraventricular tachycardia, unspecified: Secondary | ICD-10-CM

## 2018-11-26 NOTE — Telephone Encounter (Signed)
Pt scheduled for SVT ablation on December 06, 2018 at 11:00 am.  Will get labs/covid test on June 22.  Instruction letter sent via mychart.

## 2018-12-03 ENCOUNTER — Other Ambulatory Visit: Payer: Self-pay

## 2018-12-03 ENCOUNTER — Other Ambulatory Visit (HOSPITAL_COMMUNITY)
Admission: RE | Admit: 2018-12-03 | Discharge: 2018-12-03 | Disposition: A | Discharge status: Home / Self Care | Payer: 59 | Source: Ambulatory Visit | Attending: Internal Medicine | Admitting: Internal Medicine

## 2018-12-03 ENCOUNTER — Other Ambulatory Visit: Payer: 59 | Admitting: *Deleted

## 2018-12-03 DIAGNOSIS — I471 Supraventricular tachycardia, unspecified: Secondary | ICD-10-CM

## 2018-12-03 DIAGNOSIS — Z1159 Encounter for screening for other viral diseases: Secondary | ICD-10-CM | POA: Insufficient documentation

## 2018-12-03 LAB — BASIC METABOLIC PANEL
BUN/Creatinine Ratio: 10 (ref 9–23)
BUN: 8 mg/dL (ref 6–20)
CO2: 22 mmol/L (ref 20–29)
Calcium: 9.5 mg/dL (ref 8.7–10.2)
Chloride: 96 mmol/L (ref 96–106)
Creatinine, Ser: 0.79 mg/dL (ref 0.57–1.00)
GFR calc Af Amer: 110 mL/min/{1.73_m2} (ref >59)
GFR calc non Af Amer: 95 mL/min/{1.73_m2} (ref >59)
Glucose: 275 mg/dL — ABNORMAL HIGH (ref 65–99)
Potassium: 4.9 mmol/L (ref 3.5–5.2)
Sodium: 135 mmol/L (ref 134–144)

## 2018-12-03 LAB — CBC WITH DIFFERENTIAL/PLATELET
Basophils Absolute: 0.1 10*3/uL (ref 0.0–0.2)
Basos: 1 %
EOS (ABSOLUTE): 0.3 10*3/uL (ref 0.0–0.4)
Eos: 4 %
Hematocrit: 41.2 % (ref 34.0–46.6)
Hemoglobin: 14 g/dL (ref 11.1–15.9)
Immature Grans (Abs): 0 10*3/uL (ref 0.0–0.1)
Immature Granulocytes: 0 %
Lymphocytes Absolute: 2.4 10*3/uL (ref 0.7–3.1)
Lymphs: 34 %
MCH: 28.2 pg (ref 26.6–33.0)
MCHC: 34 g/dL (ref 31.5–35.7)
MCV: 83 fL (ref 79–97)
Monocytes Absolute: 0.3 10*3/uL (ref 0.1–0.9)
Monocytes: 5 %
Neutrophils Absolute: 3.9 10*3/uL (ref 1.4–7.0)
Neutrophils: 56 %
Platelets: 252 10*3/uL (ref 150–450)
RBC: 4.97 x10E6/uL (ref 3.77–5.28)
RDW: 14.1 % (ref 11.7–15.4)
WBC: 7 10*3/uL (ref 3.4–10.8)

## 2018-12-03 LAB — SARS CORONAVIRUS 2 (TAT 6-24 HRS): SARS Coronavirus 2: NEGATIVE

## 2018-12-06 ENCOUNTER — Ambulatory Visit (HOSPITAL_COMMUNITY): Payer: 59 | Admitting: Certified Registered"

## 2018-12-06 ENCOUNTER — Encounter (HOSPITAL_COMMUNITY)
Admission: RE | Disposition: A | Discharge status: Home / Self Care | Payer: Self-pay | Source: Home / Self Care | Attending: Internal Medicine

## 2018-12-06 ENCOUNTER — Ambulatory Visit (HOSPITAL_COMMUNITY)
Admission: RE | Admit: 2018-12-06 | Discharge: 2018-12-06 | Disposition: A | Discharge status: Home / Self Care | Payer: 59 | Attending: Internal Medicine | Admitting: Internal Medicine

## 2018-12-06 ENCOUNTER — Other Ambulatory Visit: Payer: Self-pay

## 2018-12-06 DIAGNOSIS — Z6838 Body mass index (BMI) 38.0-38.9, adult: Secondary | ICD-10-CM | POA: Diagnosis not present

## 2018-12-06 DIAGNOSIS — Z7984 Long term (current) use of oral hypoglycemic drugs: Secondary | ICD-10-CM | POA: Diagnosis not present

## 2018-12-06 DIAGNOSIS — Z8249 Family history of ischemic heart disease and other diseases of the circulatory system: Secondary | ICD-10-CM | POA: Insufficient documentation

## 2018-12-06 DIAGNOSIS — E663 Overweight: Secondary | ICD-10-CM | POA: Insufficient documentation

## 2018-12-06 DIAGNOSIS — E119 Type 2 diabetes mellitus without complications: Secondary | ICD-10-CM | POA: Diagnosis not present

## 2018-12-06 DIAGNOSIS — Z9851 Tubal ligation status: Secondary | ICD-10-CM | POA: Diagnosis not present

## 2018-12-06 DIAGNOSIS — I471 Supraventricular tachycardia: Secondary | ICD-10-CM | POA: Diagnosis not present

## 2018-12-06 DIAGNOSIS — Z79899 Other long term (current) drug therapy: Secondary | ICD-10-CM | POA: Insufficient documentation

## 2018-12-06 DIAGNOSIS — Z833 Family history of diabetes mellitus: Secondary | ICD-10-CM | POA: Insufficient documentation

## 2018-12-06 DIAGNOSIS — F17210 Nicotine dependence, cigarettes, uncomplicated: Secondary | ICD-10-CM | POA: Insufficient documentation

## 2018-12-06 HISTORY — PX: SVT ABLATION: EP1225

## 2018-12-06 LAB — GLUCOSE, CAPILLARY
Glucose-Capillary: 225 mg/dL — ABNORMAL HIGH (ref 70–99)
Glucose-Capillary: 147 mg/dL — ABNORMAL HIGH (ref 70–99)

## 2018-12-06 SURGERY — SVT ABLATION
Anesthesia: Monitor Anesthesia Care

## 2018-12-06 MED ORDER — HYDROCODONE-ACETAMINOPHEN 5-325 MG PO TABS
1.0000 | ORAL_TABLET | ORAL | Status: DC | PRN
  Administered 2018-12-06: 2 via ORAL
  Filled 2018-12-06: qty 2

## 2018-12-06 MED ORDER — SODIUM CHLORIDE 0.9 % IV SOLN: 250.0000 mL | INTRAVENOUS | Status: DC | PRN

## 2018-12-06 MED ORDER — SODIUM CHLORIDE 0.9% FLUSH: 3.0000 mL | Freq: Two times a day (BID) | INTRAVENOUS | Status: DC

## 2018-12-06 MED ORDER — PROPOFOL 500 MG/50ML IV EMUL
INTRAVENOUS | Status: DC | PRN
  Administered 2018-12-06: 75 ug/kg/min via INTRAVENOUS

## 2018-12-06 MED ORDER — ONDANSETRON HCL 4 MG/2ML IJ SOLN
INTRAMUSCULAR | Status: DC | PRN
  Administered 2018-12-06: 4 mg via INTRAVENOUS

## 2018-12-06 MED ORDER — ACETAMINOPHEN 325 MG PO TABS: 650.0000 mg | ORAL_TABLET | ORAL | Status: DC | PRN

## 2018-12-06 MED ORDER — FENTANYL CITRATE (PF) 250 MCG/5ML IJ SOLN
INTRAMUSCULAR | Status: DC | PRN
  Administered 2018-12-06: 50 ug via INTRAVENOUS

## 2018-12-06 MED ORDER — BUPIVACAINE HCL (PF) 0.25 % IJ SOLN
INTRAMUSCULAR | Status: DC | PRN
  Administered 2018-12-06: 45 mL

## 2018-12-06 MED ORDER — ONDANSETRON HCL 4 MG/2ML IJ SOLN: 4.0000 mg | Freq: Four times a day (QID) | INTRAMUSCULAR | Status: DC | PRN

## 2018-12-06 MED ORDER — SODIUM CHLORIDE 0.9 % IV SOLN
INTRAVENOUS | Status: DC
  Administered 2018-12-06 (×2): via INTRAVENOUS

## 2018-12-06 MED ORDER — BUPIVACAINE HCL (PF) 0.25 % IJ SOLN
INTRAMUSCULAR | Status: AC
  Filled 2018-12-06: qty 60

## 2018-12-06 MED ORDER — ISOPROTERENOL HCL 0.2 MG/ML IJ SOLN
INTRAMUSCULAR | Status: AC
  Filled 2018-12-06: qty 5

## 2018-12-06 MED ORDER — MIDAZOLAM HCL 5 MG/5ML IJ SOLN
INTRAMUSCULAR | Status: DC | PRN
  Administered 2018-12-06: 2 mg via INTRAVENOUS

## 2018-12-06 MED ORDER — SODIUM CHLORIDE 0.9% FLUSH: 3.0000 mL | INTRAVENOUS | Status: DC | PRN

## 2018-12-06 SURGICAL SUPPLY — 15 items
BAG SNAP BAND KOVER 36X36 (MISCELLANEOUS) ×1 IMPLANT
BLANKET WARM UNDERBOD FULL ACC (MISCELLANEOUS) ×1 IMPLANT
CATH EZ STEER NAV 4MM D-F CUR (ABLATOR) ×1 IMPLANT
CATH JOSEPHSON QUAD-ALLRED 6FR (CATHETERS) ×2 IMPLANT
CATH WEBSTER BI DIR CS D-F CRV (CATHETERS) ×1 IMPLANT
INTRODUCER SWARTZ SRO 8F (SHEATH) ×1 IMPLANT
PACK EP LATEX FREE (CUSTOM PROCEDURE TRAY) ×2
PACK EP LF (CUSTOM PROCEDURE TRAY) ×1 IMPLANT
PAD PRO RADIOLUCENT 2001M-C (PAD) ×2 IMPLANT
PATCH CARTO3 (PAD) ×1 IMPLANT
SHEATH PINNACLE 6F 10CM (SHEATH) ×1 IMPLANT
SHEATH PINNACLE 7F 10CM (SHEATH) ×1 IMPLANT
SHEATH PINNACLE 8F 10CM (SHEATH) ×1 IMPLANT
SHEATH PINNACLE 9F 10CM (SHEATH) ×1 IMPLANT
SHIELD RADPAD SCOOP 12X17 (MISCELLANEOUS) ×2 IMPLANT

## 2018-12-06 NOTE — Discharge Instructions (Signed)
Cardiac Ablation, Care After This sheet gives you information about how to care for yourself after your procedure. Your health care provider may also give you more specific instructions. If you have problems or questions, contact your health care provider. What can I expect after the procedure? After the procedure, it is common to have:  Bruising around your puncture site.  Tenderness around your puncture site.  Skipped heartbeats.  Tiredness (fatigue). Follow these instructions at home: Puncture site care   Follow instructions from your health care provider about how to take care of your puncture site. Make sure you: ? Wash your hands with soap and water before you change your bandage (dressing). If soap and water are not available, use hand sanitizer. ? Change your dressing as told by your health care provider. ? Leave stitches (sutures), skin glue, or adhesive strips in place. These skin closures may need to stay in place for up to 2 weeks. If adhesive strip edges start to loosen and curl up, you may trim the loose edges. Do not remove adhesive strips completely unless your health care provider tells you to do that.  Check your puncture site every day for signs of infection. Check for: ? Redness, swelling, or pain. ? Fluid or blood. If your puncture site starts to bleed, lie down on your back, apply firm pressure to the area, and contact your health care provider. ? Warmth. ? Pus or a bad smell. Driving  Ask your health care provider when it is safe for you to drive again after the procedure.  Do not drive or use heavy machinery while taking prescription pain medicine.  Do not drive for 24 hours if you were given a medicine to help you relax (sedative) during your procedure. Activity  Avoid activities that take a lot of effort for at least 3 days after your procedure.  Do not lift anything that is heavier than 10 lb (4.5 kg), or the limit that you are told, until your health  care provider says that it is safe.  Return to your normal activities as told by your health care provider. Ask your health care provider what activities are safe for you. General instructions  Take over-the-counter and prescription medicines only as told by your health care provider.  Do not use any products that contain nicotine or tobacco, such as cigarettes and e-cigarettes. If you need help quitting, ask your health care provider.  Do not take baths, swim, or use a hot tub until your health care provider approves.  Do not drink alcohol for 24 hours after your procedure.  Keep all follow-up visits as told by your health care provider. This is important. Contact a health care provider if:  You have redness, mild swelling, or pain around your puncture site.  You have fluid or blood coming from your puncture site that stops after applying firm pressure to the area.  Your puncture site feels warm to the touch.  You have pus or a bad smell coming from your puncture site.  You have a fever.  You have chest pain or discomfort that spreads to your neck, jaw, or arm.  You are sweating a lot.  You feel nauseous.  You have a fast or irregular heartbeat.  You have shortness of breath.  You are dizzy or light-headed and feel the need to lie down.  You have pain or numbness in the arm or leg closest to your puncture site. Get help right away if:  Your puncture  site suddenly swells.  Your puncture site is bleeding and the bleeding does not stop after applying firm pressure to the area. These symptoms may represent a serious problem that is an emergency. Do not wait to see if the symptoms will go away. Get medical help right away. Call your local emergency services (911 in the [place] Do not drive yourself to the hospital. Summary  After the procedure, it is normal to have bruising and tenderness at the puncture site in your groin, neck, or forearm.  Check your puncture site every  day for signs of infection.  Get help right away if your puncture site is bleeding and the bleeding does not stop after applying firm pressure to the area. This is a medical emergency. This information is not intended to replace advice given to you by your health care provider. Make sure you discuss any questions you have with your health care provider. Document Released: 09/08/2016 Document Revised: 09/08/2016 Document Reviewed: 09/08/2016    Elsevier Interactive Patient Education  2019 [place]. Femoral Site Care This sheet gives you information about how to care for yourself after your procedure. Your health care provider may also give you more specific instructions. If you have problems or questions, contact your health care provider. What can I expect after the procedure? After the procedure, it is common to have:  Bruising that usually fades within 1-2 weeks.  Tenderness at the site. Follow these instructions at home: Wound care  Follow instructions from your health care provider about how to take care of your insertion site. Make sure you: ? Wash your hands with soap and water before you change your bandage (dressing). If soap and water are not available, use hand sanitizer. ? Change your dressing as told by your health care provider. ? Leave stitches (sutures), skin glue, or adhesive strips in place. These skin closures may need to stay in place for 2 weeks or longer. If adhesive strip edges start to loosen and curl up, you may trim the loose edges. Do not remove adhesive strips completely unless your health care provider tells you to do that.  Do not take baths, swim, or use a hot tub until your health care provider approves.  You may shower 24-48 hours after the procedure or as told by your health care provider. ? Gently wash the site with plain soap and water. ? Pat the area dry with a clean towel. ? Do not rub the site. This may cause bleeding.  Do not apply powder or  lotion to the site. Keep the site clean and dry.  Check your femoral site every day for signs of infection. Check for: ? Redness, swelling, or pain. ? Fluid or blood. ? Warmth. ? Pus or a bad smell. Activity  For the first 2-3 days after your procedure, or as long as directed: ? Avoid climbing stairs as much as possible. ? Do not squat.  Do not lift anything that is heavier than 10 lb (4.5 kg), or the limit that you are told, until your health care provider says that it is safe.  Rest as directed. ? Avoid sitting for a long time without moving. Get up to take short walks every 1-2 hours.  Do not drive for 24 hours if you were given a medicine to help you relax (sedative). General instructions  Take over-the-counter and prescription medicines only as told by your health care provider.  Keep all follow-up visits as told by your health care provider. This  is important. Contact a health care provider if you have:  A fever or chills.  You have redness, swelling, or pain around your insertion site. Get help right away if:  The catheter insertion area swells very fast.  You pass out.  You suddenly start to sweat or your skin gets clammy.  The catheter insertion area is bleeding, and the bleeding does not stop when you hold steady pressure on the area.  The area near or just beyond the catheter insertion site becomes pale, cool, tingly, or numb. These symptoms may represent a serious problem that is an emergency. Do not wait to see if the symptoms will go away. Get medical help right away. Call your local emergency services (911 in the [place] Do not drive yourself to the hospital. Summary  After the procedure, it is common to have bruising that usually fades within 1-2 weeks.  Check your femoral site every day for signs of infection.  Do not lift anything that is heavier than 10 lb (4.5 kg), or the limit that you are told, until your health care provider says that it is  safe. This information is not intended to replace advice given to you by your health care provider. Make sure you discuss any questions you have with your health care provider. Document Released: 01/31/2014 Document Revised: 06/12/2017 Document Reviewed: 06/12/2017 Elsevier Interactive Patient Education  2019 [employer].

## 2018-12-06 NOTE — Progress Notes (Signed)
Up and walked and tolerated well; right groin stable no bleeding or hematoma 

## 2018-12-06 NOTE — Interval H&P Note (Signed)
History and Physical Interval Note:  12/06/2018 11:01 AM  Valerie [name]  has presented today for surgery, with the diagnosis of svt.  The various methods of treatment have been discussed with the patient and family. After consideration of risks, benefits and other options for treatment, the patient has consented to  Procedure(s): SVT ABLATION (N/A) as a surgical intervention.  The patient's history has been reviewed, patient examined, no change in status, stable for surgery.  I have reviewed the patient's chart and labs.  Questions were answered to the patient's satisfaction.    Therapeutic strategies for supraventricular tachycardia including medicine and ablation were discussed in detail with the patient today. Risk, benefits, and alternatives to EP study and radiofrequency ablation were also discussed in detail today. These risks include but are not limited to stroke, bleeding, vascular damage, tamponade, perforation, damage to the heart and other structures, AV block requiring pacemaker, worsening renal function, and death. The patient understands these risk and wishes to proceed.    Thompson Grayer

## 2018-12-06 NOTE — Progress Notes (Signed)
    6, 7, and 9 Fr. R F/V sheats were pulled by Suella Broad RN, and manual pressure was held for 15 min. R groin is soft and non tender. Sterile gauze was applied at the site.  R DP was palpable before and after the sheath pull. Bed rest started at 1420 X 6 hr. Instructions were given to patient about the bed rest time.     HR 111 ST BP 143/92 sPO2 95% on R/A

## 2018-12-06 NOTE — Anesthesia Postprocedure Evaluation (Signed)
Anesthesia Post Note  Patient: Dahlila L Furniss-Roe  Procedure(s) Performed: SVT ABLATION (N/A )     Patient location during evaluation: Cath Lab Anesthesia Type: MAC Level of consciousness: awake and alert Pain management: pain level controlled Vital Signs Assessment: post-procedure vital signs reviewed and stable Respiratory status: spontaneous breathing, nonlabored ventilation and respiratory function stable Cardiovascular status: stable and blood pressure returned to baseline Postop Assessment: no apparent nausea or vomiting Anesthetic complications: no    Last Vitals:  Vitals:   12/06/18 1728 12/06/18 1828  BP: 132/82 (!) 158/103  Pulse: 97 92  Resp: 14 16  Temp:    SpO2: 94% 97%    Last Pain:  Vitals:   12/06/18 1558  TempSrc:   PainSc: 0-No pain                 Jermario Kalmar,W. EDMOND

## 2018-12-06 NOTE — Anesthesia Preprocedure Evaluation (Addendum)
Anesthesia Evaluation  Patient identified by MRN, date of birth, ID band Patient awake    Reviewed: Allergy & Precautions, NPO status , Patient's Chart, lab work & pertinent test results, reviewed documented beta blocker date and time   History of Anesthesia Complications Negative for: history of anesthetic complications  Airway Mallampati: II  TM Distance: >3 FB Neck ROM: Full    Dental  (+) Dental Advisory Given   Pulmonary Current Smoker,    breath sounds clear to auscultation       Cardiovascular hypertension, Pt. on medications + dysrhythmias Supra Ventricular Tachycardia  Rhythm:Regular Rate:Normal     Neuro/Psych PSYCHIATRIC DISORDERS Anxiety negative neurological ROS     GI/Hepatic negative GI ROS, Neg liver ROS,   Endo/Other  diabetes, Type 2, Oral Hypoglycemic Agents Obesity   Renal/GU negative Renal ROS     Musculoskeletal negative musculoskeletal ROS (+)   Abdominal   Peds  Hematology negative hematology ROS (+)   Anesthesia Other Findings   Reproductive/Obstetrics  S/p tubal ligation                             Anesthesia Physical Anesthesia Plan  ASA: II  Anesthesia Plan: MAC   Post-op Pain Management:    Induction: Intravenous  PONV Risk Score and Plan: 2 and Propofol infusion and Treatment may vary due to age or medical condition  Airway Management Planned: Natural Airway and Simple Face Mask  Additional Equipment: None  Intra-op Plan:   Post-operative Plan:   Informed Consent: I have reviewed the patients History and Physical, chart, labs and discussed the procedure including the risks, benefits and alternatives for the proposed anesthesia with the patient or authorized representative who has indicated his/her understanding and acceptance.       Plan Discussed with: CRNA and Anesthesiologist  Anesthesia Plan Comments:        Anesthesia Quick  Evaluation

## 2018-12-06 NOTE — Transfer of Care (Signed)
Immediate Anesthesia Transfer of Care Note  Patient: Valerie [name]  Procedure(s) Performed: SVT ABLATION (N/A )  Patient Location: Cath Lab  Anesthesia Type:MAC  Level of Consciousness: awake, alert  and oriented  Airway & Oxygen Therapy: Patient Spontanous Breathing and Patient connected to face mask oxygen  Post-op Assessment: Report given to RN, Post -op Vital signs reviewed and stable and Patient moving all extremities X 4  Post vital signs: Reviewed and stable  Last Vitals:  Vitals Value Taken Time  BP    Temp    Pulse    Resp    SpO2      Last Pain:  Vitals:   12/06/18 0913  TempSrc:   PainSc: 3       Patients Stated Pain Goal: 5 (56/86/16 8372)  Complications: No apparent anesthesia complications

## 2018-12-07 ENCOUNTER — Encounter (HOSPITAL_COMMUNITY): Payer: Self-pay | Admitting: Internal Medicine

## 2018-12-31 ENCOUNTER — Telehealth: Payer: Self-pay

## 2018-12-31 NOTE — Telephone Encounter (Signed)
Left message regarding appt on 01/07/19. 

## 2019-01-07 ENCOUNTER — Telehealth (INDEPENDENT_AMBULATORY_CARE_PROVIDER_SITE_OTHER): Payer: 59 | Admitting: Internal Medicine

## 2019-01-07 ENCOUNTER — Encounter: Payer: Self-pay | Admitting: Internal Medicine

## 2019-01-07 VITALS — BP 122/104 | HR 89 | Ht 67.0 in | Wt 248.0 lb

## 2019-01-07 DIAGNOSIS — I471 Supraventricular tachycardia: Secondary | ICD-10-CM

## 2019-01-07 DIAGNOSIS — I10 Essential (primary) hypertension: Secondary | ICD-10-CM | POA: Diagnosis not present

## 2019-01-07 NOTE — Progress Notes (Signed)
Electrophysiology TeleHealth Note   Due to national recommendations of social distancing due to COVID 19, an audio/video telehealth visit is felt to be most appropriate for this patient at this time.  See MyChart message from today for the patient's consent to telehealth for [facility].   Date:  01/07/2019   ID:  Valerie [name], [place] 1981/02/02, MRN [id number]  Location: patient's home  Provider location:  [facility]  Evaluation Performed: Follow-up visit  PCP:  [name], PA-C   Electrophysiologist:  Dr [name]  Chief Complaint:  palpitations  History of Present Illness:    Valerie [name] is a 38 y.o. female who presents via telehealth conferencing today.  Since last being seen in our clinic, the patient reports doing reasonably well  She reports feeling weak over the past weekend with associated diarrhea and chest discomfort.  She was COVID tested and negative.  She works in a very hot production facility and thinks that the heat may be affecting her.  She denies any further SVT.  She has had some atypical chest pain.  Her BP has been elevated at times.  Today, she denies symptoms of palpitations, chest pain, shortness of breath,  lower extremity edema, dizziness, presyncope, or syncope.  The patient is otherwise without complaint today.  The patient denies symptoms of fevers, chills, cough, or new SOB worrisome for COVID 19.  Past Medical History:  Diagnosis Date  . Anxiety   . Bacterial vaginosis   . Bronchitis   . Diabetes (HCC)   . Overweight   . SVT (supraventricular tachycardia) (HCC)     Past Surgical History:  Procedure Laterality Date  . CESAREAN SECTION    . INDUCED ABORTION    . SVT ABLATION N/A 12/06/2018   Procedure: SVT ABLATION;  Surgeon: Hillis Range, MD;  [facility]: [facility];  Service: Cardiovascular;  Laterality: N/A;  . TONSILLECTOMY    . TUBAL LIGATION      Current Outpatient Medications  Medication Sig Dispense Refill   . acetaminophen (TYLENOL) 500 MG tablet Take 500 mg by mouth every 6 (six) hours as needed for moderate pain.    Marland Kitchen albuterol (PROVENTIL HFA;VENTOLIN HFA) 108 (90 BASE) MCG/ACT inhaler Inhale 1-2 puffs into the lungs every 6 (six) hours as needed for wheezing or shortness of breath. 1 Inhaler 0  . ALPRAZolam (XANAX) 0.5 MG tablet Take 0.5 mg by mouth 3 (three) times daily as needed (anxiety).   5  . atorvastatin (LIPITOR) 10 MG tablet Take 10 mg by mouth daily.    Marland Kitchen ibuprofen (ADVIL) 800 MG tablet Take 800 mg by mouth every 8 (eight) hours as needed (pain.).    Marland Kitchen metFORMIN (GLUCOPHAGE) 1000 MG tablet Take 500 mg by mouth 4 (four) times daily.    . metoprolol succinate (TOPROL-XL) 25 MG 24 hr tablet Take 50 mg by mouth 2 (two) times a day.    . ondansetron (ZOFRAN-ODT) 4 MG disintegrating tablet Take 4 mg by mouth every 8 (eight) hours as needed for nausea or vomiting.     No current facility-administered medications for this visit.     Allergies:   Milk-related compounds   Social History:  The patient  reports that she has been smoking cigarettes. She has been smoking about 0.50 packs per day. She has never used smokeless tobacco. She reports current alcohol use. She reports that she does not use drugs.   Family History:  The patient's family history includes Diabetes in  her mother and another family member; Hypertension in her mother and another family member.   ROS:  Please see the history of present illness.   All other systems are personally reviewed and negative.    Exam:    Vital Signs:  BP (!) 122/104   Pulse 89   Ht 5\' 7"  (1.702 m)   Wt 248 lb (112.5 kg)   LMP 03/09/2018   BMI 38.84 kg/m   Well appearing today, OP clear, normal WOB   Labs/Other Tests and Data Reviewed:    Recent Labs: 12/03/2018: BUN 8; Creatinine, Ser 0.79; Hemoglobin 14.0; Platelets 252; Potassium 4.9; Sodium 135   Wt Readings from Last 3 Encounters:  01/07/19 248 lb (112.5 kg)  12/06/18 245 lb  (111.1 kg)  11/09/18 245 lb (111.1 kg)     ASSESSMENT & PLAN:    1.  SVT Resolved post ablation for AVNRT  2. Atypical chest pain, diarrhea, fatigue Recently tested covid negative She thinks that she is having "heat exhaustion" due to very hot working conditions. I have advised that she follow-up with primary care.  3. HTN Recently elevated Consider reducing metoprolol as BP allows.  Follow-up:  Return as needed  Patient Risk:  after full review of this patients clinical status, I feel that they are at moderate risk at this time.  Today, I have spent 15 minutes with the patient with telehealth technology discussing arrhythmia management .    Randolm Idol, MD  01/07/2019 11:30 AM     [facility] [address] [place] [address] [phone] (office) [phone] (fax)

## 2019-03-07 ENCOUNTER — Other Ambulatory Visit: Payer: Self-pay | Admitting: Cardiology

## 2019-03-19 ENCOUNTER — Other Ambulatory Visit: Payer: Self-pay

## 2019-03-19 DIAGNOSIS — Z20822 Contact with and (suspected) exposure to covid-19: Secondary | ICD-10-CM

## 2019-03-21 ENCOUNTER — Telehealth: Payer: Self-pay | Admitting: General Practice

## 2019-03-21 LAB — NOVEL CORONAVIRUS, NAA: SARS-CoV-2, NAA: NOT DETECTED

## 2019-03-21 NOTE — Telephone Encounter (Signed)
Negative COVID results given. Patient results "NOT Detected." Caller expressed understanding. ° °

## 2019-05-19 ENCOUNTER — Other Ambulatory Visit: Payer: Self-pay | Admitting: Cardiology

## 2019-06-11 ENCOUNTER — Other Ambulatory Visit: Payer: Self-pay | Admitting: Cardiology

## 2019-06-20 ENCOUNTER — Ambulatory Visit: Payer: 59 | Admitting: Orthopaedic Surgery

## 2019-06-27 ENCOUNTER — Other Ambulatory Visit: Payer: Self-pay | Admitting: *Deleted

## 2019-06-27 MED ORDER — METOPROLOL SUCCINATE ER 25 MG PO TB24: ORAL_TABLET | ORAL | 0 refills | Status: DC

## 2019-07-02 ENCOUNTER — Other Ambulatory Visit: Payer: Self-pay | Admitting: *Deleted

## 2019-07-02 MED ORDER — METOPROLOL SUCCINATE ER 25 MG PO TB24: ORAL_TABLET | ORAL | 0 refills | Status: DC

## 2019-07-04 ENCOUNTER — Ambulatory Visit: Payer: Self-pay

## 2019-07-04 ENCOUNTER — Ambulatory Visit (INDEPENDENT_AMBULATORY_CARE_PROVIDER_SITE_OTHER): Payer: 59 | Admitting: Orthopaedic Surgery

## 2019-07-04 ENCOUNTER — Encounter: Payer: Self-pay | Admitting: Orthopaedic Surgery

## 2019-07-04 VITALS — BP 129/101 | HR 88 | Ht 67.0 in | Wt 238.0 lb

## 2019-07-04 DIAGNOSIS — G8929 Other chronic pain: Secondary | ICD-10-CM | POA: Diagnosis not present

## 2019-07-04 DIAGNOSIS — M25561 Pain in right knee: Secondary | ICD-10-CM

## 2019-07-04 NOTE — Progress Notes (Signed)
Office Visit Note   Patient: Valerie [name]           Date of Birth: 1980-09-12           MRN: [id number] Visit Date: 07/04/2019              Requested by: [name], PA-C [address] [place],  [place] [address] PCP: [name], PA-C   Assessment & Plan: Visit Diagnoses:  1. Chronic pain of right knee     Plan: Patient is having primarily medial joint line problems was diagnosed with possible meniscal tear medially in the past with an old injury originally 13 years ago.  We will set up for some therapy for treatment.  She can continue the Relafen and Tylenol, knee ice application.  I will recheck her in 4 weeks if she is having persistent problems and does not respond to therapy then MRI scan will be needed to evaluate for medial meniscal tear, chronic.  Follow-Up Instructions: No follow-ups on file.   Orders:  Orders Placed This Encounter  Procedures  . XR KNEE 3 VIEW RIGHT   No orders of the defined types were placed in this encounter.     Procedures: No procedures performed   Clinical Data: No additional findings.   Subjective: Chief Complaint  Patient presents with  . Right Knee - Pain    HPI 39 year old female seen with right knee pain primarily medially.  She was told she might have a meniscal tear last year.  She injured her knee and initially 13 years ago with a twisting injury.  She states she has had 1 cortisone injection last year really did not improve her symptoms she has been taking Relafen also Tylenol.  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  She has to climb on machine at times.  She denies true locking of her knee.  She has not noticed significant swelling.  She does have diabetes has been on Metformin.  A1c recently is gone up to 8 prior to that it was below 7 for the last 2 years.  After work she limps at times.  Pain is more medially.  She is able to walk up and down stairs.  Recent cardiac ablation for SVT.  Review of  Systems previous surgeries include hysterectomy tonsillectomy C-section.  Patient is 1 pack a day smoker positive for anxiety bronchitis depression hypertension.  Otherwise negative as pertains HPI other than as mentioned above.   Objective: Vital Signs: BP (!) 129/101   Pulse 88   Ht 5\' 7"  (1.702 m)   Wt 238 lb (108 kg)   LMP 03/09/2018   BMI 37.28 kg/m   Physical Exam Constitutional:      Appearance: She is well-developed.  HENT:     Head: Normocephalic.     Right Ear: External ear normal.     Left Ear: External ear normal.  Eyes:     Pupils: Pupils are equal, round, and reactive to light.  Neck:     Thyroid: No thyromegaly.     Trachea: No tracheal deviation.  Cardiovascular:     Rate and Rhythm: Normal rate.  Pulmonary:     Effort: Pulmonary effort is normal.  Abdominal:     Palpations: Abdomen is soft.  Skin:    General: Skin is warm and dry.  Neurological:     Mental Status: She is alert and oriented to person, place, and time.  Psychiatric:  Behavior: Behavior normal.     Ortho Exam patient has some crepitus with knee extension negative logroll hip negative straight leg raising no sciatic notch tenderness she is amatory with only a trace limp tends to limit her flexion of her knee.  She has some medial joint line tenderness.  Collateral ligaments are stable with no tenderness over the MCL origin on the femur.  Mild crepitus with knee extension right and left.  ACL PCL exam is normal distal pulses are 2 Homans no calf swelling.  Trochanteric bursa is normal iliotibial band normal and tib-fib joint is normal.  Specialty Comments:  No specialty comments available.  Imaging: No results found.   PMFS History: There are no problems to display for this patient.  Past Medical History:  Diagnosis Date  . Anxiety   . Bacterial vaginosis   . Bronchitis   . Diabetes (HCC)   . Overweight   . SVT (supraventricular tachycardia) (HCC)     Family History    Problem Relation Age of Onset  . Diabetes Mother   . Hypertension Mother   . Diabetes Other   . Hypertension Other     Past Surgical History:  Procedure Laterality Date  . CESAREAN SECTION    . INDUCED ABORTION    . SVT ABLATION N/A 12/06/2018   Procedure: SVT ABLATION;  Surgeon: Hillis Range, MD;  [facility]: [facility];  Service: Cardiovascular;  Laterality: N/A;  . TONSILLECTOMY    . TUBAL LIGATION     Social History   Occupational History  . Not on file  Tobacco Use  . Smoking status: Current Every Day Smoker    Packs/day: 0.50    Types: Cigarettes  . Smokeless tobacco: Never Used  . Tobacco comment: almost 1 pack daily  Substance and Sexual Activity  . Alcohol use: Yes    Comment: rare  . Drug use: No  . Sexual activity: Never    Birth control/protection: Surgical

## 2019-07-16 ENCOUNTER — Telehealth: Payer: Self-pay | Admitting: Orthopaedic Surgery

## 2019-07-16 NOTE — Telephone Encounter (Signed)
Please advise. It looks like Relafen was called in by her PCP.

## 2019-07-16 NOTE — Telephone Encounter (Signed)
Patient called and stated that she is really having problems with knee and in pain. Medication Family MD gave her is not working.  Wanting Dr Lorin Mercy to call her something in.PCP called in [place] and its causing her heart to beat hard.  Please call patient to advise.  [phone]

## 2019-07-17 NOTE — Telephone Encounter (Signed)
FYI I called and discussed,  [place] site # is high.  She will call the doc who gave her Rx. FYi

## 2019-07-17 NOTE — Telephone Encounter (Signed)
noted 

## 2019-08-06 ENCOUNTER — Telehealth: Payer: Self-pay | Admitting: Radiology

## 2019-08-06 DIAGNOSIS — M25561 Pain in right knee: Secondary | ICD-10-CM

## 2019-08-06 DIAGNOSIS — G8929 Other chronic pain: Secondary | ICD-10-CM

## 2019-08-06 NOTE — Telephone Encounter (Signed)
OK thanks ROV after MRI .

## 2019-08-06 NOTE — Addendum Note (Signed)
Addended by: Meyer Cory on: 08/06/2019 04:03 PM   Modules accepted: Orders

## 2019-08-06 NOTE — Telephone Encounter (Signed)
Patient requests we enter order for MRI Right Knee. She went to PT x 2 weeks and then had to stop going due to change in hours with her job. She did not notice relief from PT and would like to know if we could order MRI? Patient requests it be done at [place] for order?

## 2019-08-06 NOTE — Telephone Encounter (Signed)
Order entered. I called patient and advised. 

## 2019-08-06 NOTE — Telephone Encounter (Signed)
MRI order has been entered for patient. She would like for this to be scheduled at [facility] in [place].  Thanks.

## 2019-08-22 ENCOUNTER — Ambulatory Visit (INDEPENDENT_AMBULATORY_CARE_PROVIDER_SITE_OTHER): Payer: 59 | Admitting: Orthopaedic Surgery

## 2019-08-22 ENCOUNTER — Encounter: Payer: Self-pay | Admitting: Orthopaedic Surgery

## 2019-08-22 VITALS — BP 148/108 | HR 101 | Ht 67.0 in | Wt 235.0 lb

## 2019-08-22 DIAGNOSIS — G8929 Other chronic pain: Secondary | ICD-10-CM | POA: Diagnosis not present

## 2019-08-22 DIAGNOSIS — M25561 Pain in right knee: Secondary | ICD-10-CM

## 2019-08-22 MED ORDER — MELOXICAM 15 MG PO TABS: 15.0000 mg | ORAL_TABLET | Freq: Every day | ORAL | 1 refills | Status: DC

## 2019-08-22 NOTE — Progress Notes (Signed)
Office Visit Note   Patient: Valerie [name]           Date of Birth: 12/07/1980           MRN: [id number] Visit Date: 08/22/2019              Requested by: [name], PA-C [address] [place],  [place] [address] PCP: [name], PA-C   Assessment & Plan: Visit Diagnoses:  1. Chronic pain of right knee   2. Anterior knee pain, right     Plan: We reviewed MRI findings images and report.  We will set her up for some physical therapy at [facility] which is close to where she lives for treatment of chondromalacia patella with terminal arc quads isometric quadriceps contractures and straight leg raising against resistance.  Recheck 6 weeks.  Follow-Up Instructions: Return in about 6 weeks (around 10/03/2019).   Orders:  Orders Placed This Encounter  Procedures  . Ambulatory referral to Physical Therapy   Meds ordered this encounter  Medications  . meloxicam (MOBIC) 15 MG tablet    Sig: Take 1 tablet (15 mg total) by mouth daily.    Dispense:  30 tablet    Refill:  1      Procedures: No procedures performed   Clinical Data: No additional findings.   Subjective: Chief Complaint  Patient presents with  . Right Knee - Pain, Follow-up    MRI Right Knee Review    HPI 39 year old female returns with continued problems with the right knee.  She has had problems in her knee feels like it will give way pain is made anteriorly located.  Old injury 13 years ago.  She has had Relafen and Tylenol intermittent ice.  Therapy was ordered but she had not gone since last visit 07/04/2019.  Patient does have diabetes.  At times pain is worse and she notes that she is limping problems going up and down stairs.  Review of Systems 14 point review of systems unchanged from 07/04/2019 other than as mentioned in HPI.   Objective: Vital Signs: BP (!) 148/108   Pulse (!) 101   Ht 5\' 7"  (1.702 m)   Wt 235 lb (106.6 kg)   LMP 03/09/2018   BMI 36.81 kg/m   Physical Exam  Constitutional:      Appearance: She is well-developed.  HENT:     Head: Normocephalic.     Right Ear: External ear normal.     Left Ear: External ear normal.  Eyes:     Pupils: Pupils are equal, round, and reactive to light.  Neck:     Thyroid: No thyromegaly.     Trachea: No tracheal deviation.  Cardiovascular:     Rate and Rhythm: Normal rate.  Pulmonary:     Effort: Pulmonary effort is normal.  Abdominal:     Palpations: Abdomen is soft.  Skin:    General: Skin is warm and dry.  Neurological:     Mental Status: She is alert and oriented to person, place, and time.  Psychiatric:        Behavior: Behavior normal.     Ortho Exam patient has some increased discomfort with patellofemoral loading and quadriceps contracture.  Minimal crepitus with knee extension collateral ligaments ACL PCL exam is normal.  Negative patellar subluxation.  Specialty Comments:  No specialty comments available.  Imaging:  TECHNIQUE: Multiplanar, multisequence MR imaging of the knee was performed. No intravenous contrast was administered.  COMPARISON: None.  FINDINGS: MENISCI  Medial meniscus: Intact.  Lateral meniscus: Intact.  LIGAMENTS  Cruciates: Intact.  Collaterals: Intact.  CARTILAGE  Patellofemoral: Normal.  Medial: Normal.  Lateral: Normal.  Joint: Trace amount of joint fluid.  Popliteal Fossa: No Baker's cyst.  Extensor Mechanism: Intact.  Bones: Normal marrow signal throughout.  Other: None.  IMPRESSION: Normal MRI right knee.   Electronically Signed By: Drusilla Kanner M.D. On: 08/19/2019 12:52   PMFS History: Patient Active Problem List   Diagnosis Date Noted  . Anterior knee pain, right 08/26/2019   Past Medical History:  Diagnosis Date  . Anxiety   . Bacterial vaginosis   . Bronchitis   . Diabetes (HCC)   . Overweight   . SVT (supraventricular tachycardia) (HCC)     Family History  Problem Relation Age of Onset  . Diabetes Mother   . Hypertension Mother   . Diabetes Other    . Hypertension Other     Past Surgical History:  Procedure Laterality Date  . CESAREAN SECTION    . INDUCED ABORTION    . SVT ABLATION N/A 12/06/2018   Procedure: SVT ABLATION;  Surgeon: Hillis Range, MD;  [facility]: [facility];  Service: Cardiovascular;  Laterality: N/A;  . TONSILLECTOMY    . TUBAL LIGATION     Social History   Occupational History  . Not on file  Tobacco Use  . Smoking status: Current Every Day Smoker    Packs/day: 0.50    Types: Cigarettes  . Smokeless tobacco: Never Used  . Tobacco comment: almost 1 pack daily  Substance and Sexual Activity  . Alcohol use: Yes    Comment: rare  . Drug use: No  . Sexual activity: Never    Birth control/protection: Surgical

## 2019-08-26 DIAGNOSIS — M25561 Pain in right knee: Secondary | ICD-10-CM | POA: Insufficient documentation

## 2019-10-03 ENCOUNTER — Ambulatory Visit: Payer: 59 | Admitting: Orthopaedic Surgery

## 2019-10-17 DIAGNOSIS — D1722 Benign lipomatous neoplasm of skin and subcutaneous tissue of left arm: Secondary | ICD-10-CM | POA: Insufficient documentation

## 2019-11-06 ENCOUNTER — Other Ambulatory Visit: Payer: Self-pay | Admitting: Cardiology

## 2019-11-18 ENCOUNTER — Telehealth: Payer: Self-pay | Admitting: Orthopaedic Surgery

## 2019-11-18 NOTE — Telephone Encounter (Signed)
Patient called inquiring about possible appointment with Dr [name] Glasgow for a new problem with [place], [place]; requests [place] office. States has just been to [facility] Emergency room today, and has had Xrays there. Discussed with staff who takes care of Dr Brooke Bonito schedule; no availability at [facility] Wednesday, however, offered options here in [place]. Relayed what is needed from [facility], including provider notes, Xray report and film. Patient said she also has seen Dr Lorin Mercy for her knee. Michela Pitcher will first try [place] office to see if Dr Lorin Mercy may be available today or tomorrow.

## 2019-12-26 ENCOUNTER — Other Ambulatory Visit: Payer: Self-pay

## 2019-12-26 ENCOUNTER — Ambulatory Visit (INDEPENDENT_AMBULATORY_CARE_PROVIDER_SITE_OTHER): Payer: 59 | Admitting: Orthopaedic Surgery

## 2019-12-26 DIAGNOSIS — M2241 Chondromalacia patellae, right knee: Secondary | ICD-10-CM | POA: Insufficient documentation

## 2019-12-26 MED ORDER — LIDOCAINE HCL 1 % IJ SOLN
0.5000 mL | INTRAMUSCULAR | Status: AC | PRN
  Administered 2019-12-26: .5 mL

## 2019-12-26 MED ORDER — BUPIVACAINE HCL 0.25 % IJ SOLN
4.0000 mL | INTRAMUSCULAR | Status: AC | PRN
  Administered 2019-12-26: 4 mL via INTRA_ARTICULAR

## 2019-12-26 MED ORDER — METHYLPREDNISOLONE ACETATE 40 MG/ML IJ SUSP
40.0000 mg | INTRAMUSCULAR | Status: AC | PRN
  Administered 2019-12-26: 40 mg via INTRA_ARTICULAR

## 2019-12-26 NOTE — Progress Notes (Signed)
Office Visit Note   Patient: Valerie [name]           Date of Birth: 09-06-1980           MRN: [id number] Visit Date: 12/26/2019              Requested by: [name], PA-C [address] [place],  [place] [address] PCP: [name], PA-C   Assessment & Plan: Visit Diagnoses:  1. Chondromalacia patellae, right knee     Plan: We reviewed the MRI scan.  She has small ganglion at the ACL origin on the femur.  Principal problem is patellofemoral chondral fissures with patellofemoral symptoms.  She has problems with stairs squatting climbing.  She is worked her current job for about 3 and half years she requested note being out today and also tomorrow we will look for another type of work activity where she does not have to do climbing.  We discussed working on weight loss discussed activities will help Pollice patellofemoral joint and decrease the load.  She relates [place] the [place] when she was jumping in the ways with her children she states after that episode she had great difficulty walking and sleeping.  Follow-Up Instructions: Return in about 2 months (around 02/26/2020).   Orders:  Orders Placed This Encounter  Procedures  . Large Joint Inj: R knee   No orders of the defined types were placed in this encounter.     Procedures: Large Joint Inj: R knee on 12/26/2019 9:36 AM Indications: pain and joint swelling Details: 22 G 1.5 in needle, anterolateral approach  Arthrogram: No  Medications: 40 mg methylPREDNISolone acetate 40 MG/ML; 0.5 mL lidocaine 1 %; 4 mL bupivacaine 0.25 % Outcome: tolerated well, no immediate complications Procedure, treatment alternatives, risks and benefits explained, specific risks discussed. Consent was given by the patient. Immediately prior to procedure a time out was called to verify the correct patient, procedure, equipment, support staff and site/side marked as required. Patient was prepped and draped in the usual sterile fashion.        Clinical Data: No additional findings.   Subjective: Chief Complaint  Patient presents with  . Right Knee - Pain    HPI 39 year old female returns with ongoing problems with right knee pain and has had MRI obtained on 12/24/2019. 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  This bothers her knee and she thinks she does not think she is able to resume that type of activity.  Today MRI images and report are reviewed with patient in detail. Review of Systems 14 point system update unchanged from 08/22/2019 visit.   Objective: Vital Signs: LMP 03/09/2018   Physical Exam Constitutional:      Appearance: She is well-developed.  HENT:     Head: Normocephalic.     Right Ear: External ear normal.     Left Ear: External ear normal.  Eyes:     Pupils: Pupils are equal, round, and reactive to light.  Neck:     Thyroid: No thyromegaly.     Trachea: No tracheal deviation.  Cardiovascular:     Rate and Rhythm: Normal rate.  Pulmonary:     Effort: Pulmonary effort is normal.  Abdominal:     Palpations: Abdomen is soft.  Skin:    General: Skin is warm and dry.  Neurological:     Mental  Status: She is alert and oriented to person, place, and time.  Psychiatric:        Behavior: Behavior normal.     Ortho Exam patient has some medial joint line tenderness along her right knee pain with patellofemoral loading without patellar subluxation no knee effusion.  Specialty Comments:  No specialty comments available.  Imaging:   Cruciates: There is mildly increased intrasubstance signal seen at the anterior insertion site of the ACL with minimal subchondral marrow signal change, however it is intact. The PCL is intact.  Collaterals: The MCL is intact. The lateral collateral ligamentous complex is intact.  CARTILAGE  Patellofemoral: Mild chondral fissuring seen within the  lateral patellar facet with minimal underlying subchondral marrow signal change.  Medial compartment: Normal.  Lateral compartment: Normal.  BONES: No fracture. No avascular necrosis. No pathologic marrow infiltration.  JOINT: No joint effusion. Mildly increased signal seen at the superolateral corner of Hoffa's fat pad. No plical thickening.  EXTENSOR MECHANISM: The patellar and quadriceps tendon are intact. The retinaculum is unremarkable.  POPLITEAL FOSSA: No popliteal cyst.  OTHER: The visualized muscles are normal in appearance.  IMPRESSION: Findings which could be suggestive of patellofemoral maltracking with mild patellofemoral chondral disease  Minimally increased signal at the anterior ACL which could be due to mild intrasubstance sprain.   Electronically Signed By: Jonna Clark M.D. [facility]   PMFS History: Patient Active Problem List   Diagnosis Date Noted  . Chondromalacia patellae, right knee 12/26/2019  . Anterior knee pain, right 08/26/2019   Past Medical History:  Diagnosis Date  . Anxiety   . Bacterial vaginosis   . Bronchitis   . Diabetes (HCC)   . Overweight   . SVT (supraventricular tachycardia) (HCC)     Family History  Problem Relation Age of Onset  . Diabetes Mother   . Hypertension Mother   . Diabetes Other   . Hypertension Other     Past Surgical History:  Procedure Laterality Date  . CESAREAN SECTION    . INDUCED ABORTION    . SVT ABLATION N/A 12/06/2018   Procedure: SVT ABLATION;  Surgeon: Hillis Range, MD;  Location: [facility];  Service: Cardiovascular;  Laterality: N/A;  . TONSILLECTOMY    . TUBAL LIGATION     Social History   Occupational History  . Not on file  Tobacco Use  . Smoking status: Current Every Day Smoker    Packs/day: 0.50    Types: Cigarettes  . Smokeless tobacco: Never Used  . Tobacco comment: almost 1 pack daily  Vaping Use  . Vaping Use: Never used  Substance and Sexual Activity  . Alcohol use: Yes    Comment: rare  .  Drug use: No  . Sexual activity: Never    Birth control/protection: Surgical

## 2020-02-07 ENCOUNTER — Telehealth: Payer: Self-pay | Admitting: Orthopaedic Surgery

## 2020-02-07 NOTE — Telephone Encounter (Signed)
Records faxed to [facility] [phone]

## 2020-08-27 ENCOUNTER — Ambulatory Visit: Payer: 59 | Admitting: Orthopaedic Surgery

## 2020-09-24 NOTE — Progress Notes (Signed)
Cardiology Office Note  Date: 09/25/2020   ID: Valerie [name], [place] [birth year], MRN [id number]  PCP:  [name], PA-C  Cardiologist:  No primary care provider on file. Electrophysiologist:  None   Chief Complaint: Cardiac follow up.   History of Present Illness: Valerie [name] is a 40 y.o. female with a history of SVT s/p ablation, DM2.  Last seen by Dr [name] via telemedicine visit 01/07/2019. She denied any further SVT. She had been experiencing atypical chest pain,diarrha, and fatigue.  Blood pressure had been elevated at times. No palpitations, CP, SOB, edema, dizziness, presyncope or syncope.  Dr. [name] recommended she reduce Metoprolol as BP allowed.   She is here for 1 year follow-up today.  She denies any recent issues other than she had COVID in January and has had some lingering long-term effects such as headaches/migraines/diarrhea.  Denies any recent palpitations or arrhythmias.  Blood pressure is on the low side at 100/66.  At previous visit with Dr. [name] mention she could reduce metoprolol as allowed by blood pressure.  She denies any anginal or exertional symptoms.  No orthostatic symptoms.  Denies any PND, orthopnea, bleeding, CVA or TIA-like symptoms.  Denies any claudication-like symptoms, DVT or PE-like symptoms, lower extremity edema.  EKG today shows normal sinus rhythm with a rate of 82.  States she been having episodes of vertigo and has meclizine prescribed by PCP.  She states she when she takes these pills she feels sleepy and sluggish.  She states she may decrease the dosage down to 12.5 mg for vertigo as needed.  Past Medical History:  Diagnosis Date  . Anxiety   . Bacterial vaginosis   . Bronchitis   . Diabetes (HCC)   . Overweight   . SVT (supraventricular tachycardia) (HCC)     Past Surgical History:  Procedure Laterality Date  . CESAREAN SECTION    . INDUCED ABORTION    . SVT ABLATION N/A 12/06/2018   Procedure: SVT ABLATION;  Surgeon:  Hillis Range, MD;  Location: [facility];  Service: Cardiovascular;  Laterality: N/A;  . TONSILLECTOMY    . TUBAL LIGATION      Current Outpatient Medications  Medication Sig Dispense Refill  . ADVAIR DISKUS 250-50 MCG/DOSE AEPB Inhale 1 puff into the lungs 2 (two) times daily.    Marland Kitchen albuterol (PROVENTIL HFA;VENTOLIN HFA) 108 (90 BASE) MCG/ACT inhaler Inhale 1-2 puffs into the lungs every 6 (six) hours as needed for wheezing or shortness of breath. 1 Inhaler 0  . ALPRAZolam (XANAX) 1 MG tablet Take 1 mg by mouth 3 (three) times daily as needed for anxiety.    Marland Kitchen atorvastatin (LIPITOR) 10 MG tablet Take 10 mg by mouth daily.    . Cholecalciferol (VITAMIN D3) 50 MCG (2000 UT) TABS Take 1 tablet by mouth daily at 12 noon.    . diclofenac Sodium (VOLTAREN) 1 % GEL as needed.    . gabapentin (NEURONTIN) 300 MG capsule Take 1 capsule by mouth 3 (three) times daily.    . meclizine (ANTIVERT) 25 MG tablet Take 25 mg by mouth as needed.    . metFORMIN (GLUCOPHAGE) 1000 MG tablet Take 500 mg by mouth 4 (four) times daily.    . Multiple Vitamin (MULTIVITAMIN) tablet Take 1 tablet by mouth daily.    . ondansetron (ZOFRAN-ODT) 4 MG disintegrating tablet Take 4 mg by mouth every 8 (eight) hours as needed for nausea or vomiting.    Marland Kitchen oxyCODONE-acetaminophen (PERCOCET) 10-325 MG  tablet Take 1 tablet by mouth 4 (four) times daily as needed.    . metoprolol succinate (TOPROL-XL) 25 MG 24 hr tablet Take 2 tablets (50 mg total) by mouth every morning.     No current facility-administered medications for this visit.   Allergies:  Milk-related compounds   Social History: The patient  reports that she has been smoking cigarettes. She has been smoking about 0.50 packs per day. She has never used smokeless tobacco. She reports current alcohol use. She reports that she does not use drugs.   Family History: The patient's family history includes Diabetes in her mother and another family member; Hypertension in  her mother and another family member.   ROS:  Please see the history of present illness. Otherwise, complete review of systems is positive for none.  All other systems are reviewed and negative.   Physical Exam: VS:  BP 100/66   Pulse 85   Ht 5\' 7"  (1.702 m)   Wt 246 lb 6.4 oz (111.8 kg)   LMP 03/09/2018   SpO2 92%   BMI 38.59 kg/m , BMI Body mass index is 38.59 kg/m.  Wt Readings from Last 3 Encounters:  09/25/20 246 lb 6.4 oz (111.8 kg)  08/22/19 235 lb (106.6 kg)  07/04/19 238 lb (108 kg)    General: Obese patient appears comfortable at rest. Neck: Supple, no elevated JVP or carotid bruits, no thyromegaly. Lungs: Clear to auscultation, nonlabored breathing at rest. Cardiac: Regular rate and rhythm, no S3 or significant systolic murmur, no pericardial rub. Extremities: No pitting edema, distal pulses 2+. Skin: Warm and dry. Musculoskeletal: No kyphosis. Neuropsychiatric: Alert and oriented x3, affect grossly appropriate.  ECG:  An ECG dated 09/25/2020 was personally reviewed today and demonstrated:  Normal sinus rhythm rate of 82.  Recent Labwork: No results found for requested labs within last 8760 hours.  No results found for: CHOL, TRIG, HDL, CHOLHDL, VLDL, LDLCALC, LDLDIRECT  Other Studies Reviewed Today:  SVT Ablation 12/06/2018  CONCLUSIONS:  1. Sinus rhythm upon presentation.  2. The patient had dual AV nodal physiology with easily inducible classic AV nodal reentrant tachycardia, there were no other accessory pathways or arrhythmias induced  3. Successful radiofrequency modification of the slow AV nodal pathway. Ablation was limited by catheter instability and not improved with a SR0 sheath. 4. No inducible arrhythmias following ablation.  5. No early apparent complications.    Assessment and Plan:  1. SVT (supraventricular tachycardia) (HCC)   2. Essential hypertension   3. Atypical chest pain    1. SVT (supraventricular tachycardia) (HCC) Denies any  episode of SVT, palpitations, arrhythmias.  She continues on Toprol-XL 50 mg p.o. twice daily states she has been feeling tired recently and is unsure if it is related to lingering effects of Covid in January or just tired in general or related to medications.  Decrease Toprol-XL from 50 mg p.o. twice daily to 50 mg p.o. daily due to low blood pressures and absence of palpitations.  2. Essential hypertension Blood pressure is on the low side today at 100/66.  When she was last seen by Dr. [name] in July 2020 he mentioned decreasing Toprol as blood pressure tolerated.  We will decrease Toprol to 50 mg daily only from previous dosing of 50 mg p.o. twice daily.  3. Atypical chest pain Currently denies any anginal or exertional symptoms.  4.  Hyperlipidemia. Continue atorvastatin 10 mg daily.  Medication Adjustments/Labs and Tests Ordered: Current medicines are reviewed at length with  the patient today.  Concerns regarding medicines are outlined above.   Disposition: Follow-up with Dr. Wyline Mood or APP 6 months  Signed, Rennis Harding, NP 09/25/2020 4:24 PM    [facility] [address] [place], [place], [place] [address] Phone: [phone]; Fax: [phone]

## 2020-09-25 ENCOUNTER — Ambulatory Visit (INDEPENDENT_AMBULATORY_CARE_PROVIDER_SITE_OTHER): Payer: 59 | Admitting: Family Medicine

## 2020-09-25 ENCOUNTER — Other Ambulatory Visit: Payer: Self-pay

## 2020-09-25 ENCOUNTER — Encounter: Payer: Self-pay | Admitting: Family Medicine

## 2020-09-25 VITALS — BP 100/66 | HR 85 | Ht 67.0 in | Wt 246.4 lb

## 2020-09-25 DIAGNOSIS — I471 Supraventricular tachycardia: Secondary | ICD-10-CM

## 2020-09-25 DIAGNOSIS — R0789 Other chest pain: Secondary | ICD-10-CM | POA: Diagnosis not present

## 2020-09-25 DIAGNOSIS — I10 Essential (primary) hypertension: Secondary | ICD-10-CM | POA: Diagnosis not present

## 2020-09-25 MED ORDER — METOPROLOL SUCCINATE ER 25 MG PO TB24: 50.0000 mg | ORAL_TABLET | Freq: Every morning | ORAL | Status: AC

## 2020-09-25 NOTE — Patient Instructions (Signed)
Medication Instructions:   Decrease Toprol XL to 50mg  every morning.   Continue all other medications.    Labwork: none  Testing/Procedures: none  Follow-Up: 6 months   Any Other Special Instructions Will Be Listed Below (If Applicable).  If you need a refill on your cardiac medications before your next appointment, please call your pharmacy.

## 2020-10-28 ENCOUNTER — Telehealth: Payer: Self-pay | Admitting: Neurology

## 2020-10-28 ENCOUNTER — Ambulatory Visit: Payer: 59 | Admitting: Neurology

## 2020-10-28 ENCOUNTER — Encounter: Payer: Self-pay | Admitting: Neurology

## 2020-10-28 NOTE — Telephone Encounter (Signed)
Patient no-showed new patient appointment in neurology, if she calls back she can be placed with any physician but please let her know that we have many patients waiting for an appointment and another no-show or cancellation close the appointment may result in dismissal per office policy,

## 2020-10-28 NOTE — Progress Notes (Deleted)
[facility]    Provider:  Dr Jaynee Eagles Requesting Provider: Tawni Carnes, PA Primary Care Provider:  Lanelle Bal, PA-C  CC:  ***  HPI:  Valerie [name] is a 40 y.o. female here as requested by Lanelle Bal, PA-C for headaches.  Past medical history type 2 diabetes, current smoker, tachycardia, diarrhea and headache possibly from recent COVID infection, anxiety, I reviewed [facility] notes, she states headaches are located diffusely and described as aching and started the beginning of February of this year, gradual in onset, moderately limiting activities, no further information in the consult I received.  Reviewed notes, labs and imaging from outside physicians, which showed ***  Labs collected April 28, 2020 include a CMP with elevated glucose otherwise normal with BUN 8 and creatinine is 0.93, unremarkable CBC, hemoglobin A1c 7.1  Review of Systems: Patient complains of symptoms per HPI as well as the following symptoms ***. Pertinent negatives and positives per HPI. All others negative.   Social History   Socioeconomic History  . Marital status: Single    Spouse name: Not on file  . Number of children: Not on file  . Years of education: Not on file  . Highest education level: Not on file  Occupational History  . Not on file  Tobacco Use  . Smoking status: Current Every Day Smoker    Packs/day: 0.50    Types: Cigarettes  . Smokeless tobacco: Never Used  . Tobacco comment: almost 1 pack daily  Vaping Use  . Vaping Use: Never used  Substance and Sexual Activity  . Alcohol use: Yes    Comment: rare  . Drug use: No  . Sexual activity: Never    Birth control/protection: Surgical  Other Topics Concern  . Not on file  Social History Narrative   Lives in [place] with 2 children, single.   Works at [employer] in [place] Strain: Not on [employer] Insecurity: Not on file   Transportation Needs: Not on file  Physical Activity: Not on file  Stress: Not on file  Social Connections: Not on file  Intimate Partner Violence: Not on file    Family History  Problem Relation Age of Onset  . Diabetes Mother   . Hypertension Mother   . Diabetes Other   . Hypertension Other     Past Medical History:  Diagnosis Date  . Anxiety   . Bacterial vaginosis   . Bronchitis   . Diabetes ([place])   . Overweight   . SVT (supraventricular tachycardia) [facility])     Patient Active Problem List   Diagnosis Date Noted  . Chondromalacia patellae, right knee 12/26/2019  . Anterior knee pain, right 08/26/2019    Past Surgical History:  Procedure Laterality Date  . CESAREAN SECTION    . INDUCED ABORTION    . SVT ABLATION N/A 12/06/2018   Procedure: SVT ABLATION;  Surgeon: Thompson Grayer, MD;  Location: [facility];  Service: Cardiovascular;  Laterality: N/A;  . TONSILLECTOMY    . TUBAL LIGATION      Current Outpatient Medications  Medication Sig Dispense Refill  . ADVAIR DISKUS 250-50 MCG/DOSE AEPB Inhale 1 puff into the lungs 2 (two) times daily.    Marland Kitchen albuterol (PROVENTIL HFA;VENTOLIN HFA) 108 (90 BASE) MCG/ACT inhaler Inhale 1-2 puffs into the lungs every 6 (six) hours as needed for wheezing or shortness of breath. 1 Inhaler 0  . ALPRAZolam (XANAX) 1 MG  tablet Take 1 mg by mouth 3 (three) times daily as needed for anxiety.    Marland Kitchen atorvastatin (LIPITOR) 10 MG tablet Take 10 mg by mouth daily.    . Cholecalciferol (VITAMIN D3) 50 MCG (2000 UT) TABS Take 1 tablet by mouth daily at 12 noon.    . diclofenac Sodium (VOLTAREN) 1 % GEL as needed.    . gabapentin (NEURONTIN) 300 MG capsule Take 1 capsule by mouth 3 (three) times daily.    . meclizine (ANTIVERT) 25 MG tablet Take 25 mg by mouth as needed.    . metFORMIN (GLUCOPHAGE) 1000 MG tablet Take 500 mg by mouth 4 (four) times daily.    . metoprolol succinate (TOPROL-XL) 25 MG 24 hr tablet Take 2 tablets (50 mg total)  by mouth every morning.    . Multiple Vitamin (MULTIVITAMIN) tablet Take 1 tablet by mouth daily.    . ondansetron (ZOFRAN-ODT) 4 MG disintegrating tablet Take 4 mg by mouth every 8 (eight) hours as needed for nausea or vomiting.    Marland Kitchen oxyCODONE-acetaminophen (PERCOCET) 10-325 MG tablet Take 1 tablet by mouth 4 (four) times daily as needed.     No current facility-administered medications for this visit.    Allergies as of 10/28/2020 - Review Complete 09/25/2020  Allergen Reaction Noted  . Milk-related compounds Itching 01/24/2014    Vitals: LMP 03/09/2018  Last Weight:  Wt Readings from Last 1 Encounters:  09/25/20 246 lb 6.4 oz (111.8 kg)   Last Height:   Ht Readings from Last 1 Encounters:  09/25/20 5\' 7"  (1.702 m)     Physical exam: Exam: Gen: NAD, conversant, well nourised, obese, well groomed                     CV: RRR, no MRG. No Carotid Bruits. No peripheral edema, warm, nontender Eyes: Conjunctivae clear without exudates or hemorrhage  Neuro: Detailed Neurologic Exam  Speech:    Speech is normal; fluent and spontaneous with normal comprehension.  Cognition:    The patient is oriented to person, place, and time;     recent and remote memory intact;     language fluent;     normal attention, concentration,     fund of knowledge Cranial Nerves:    The pupils are equal, round, and reactive to light. The fundi are normal and spontaneous venous pulsations are present. Visual fields are full to finger confrontation. Extraocular movements are intact. Trigeminal sensation is intact and the muscles of mastication are normal. The face is symmetric. The palate elevates in the midline. Hearing intact. Voice is normal. Shoulder shrug is normal. The tongue has normal motion without fasciculations.   Coordination:    Normal finger to nose and heel to shin. Normal rapid alternating movements.   Gait:    Heel-toe and tandem gait are normal.   Motor Observation:    No  asymmetry, no atrophy, and no involuntary movements noted. Tone:    Normal muscle tone.    Posture:    Posture is normal. normal erect    Strength:    Strength is V/V in the upper and lower limbs.      Sensation: intact to LT     Reflex Exam:  DTR's:    Deep tendon reflexes in the upper and lower extremities are normal bilaterally.   Toes:    The toes are downgoing bilaterally.   Clonus:    Clonus is absent.    Assessment/Plan:    No  orders of the defined types were placed in this encounter.  No orders of the defined types were placed in this encounter.   Cc: Lanelle Bal, Fonnie Mu, PA-C  Sarina Ill, MD  [facility] [address] [place],  [address]  Phone [phone] Fax [phone]

## 2021-06-18 ENCOUNTER — Other Ambulatory Visit: Payer: Self-pay | Admitting: Physician Assistant

## 2021-06-18 DIAGNOSIS — M545 Low back pain, unspecified: Secondary | ICD-10-CM

## 2021-06-18 DIAGNOSIS — M546 Pain in thoracic spine: Secondary | ICD-10-CM

## 2021-07-11 ENCOUNTER — Ambulatory Visit
Admission: RE | Admit: 2021-07-11 | Discharge: 2021-07-11 | Disposition: A | Discharge status: Home / Self Care | Payer: Medicaid Other | Source: Ambulatory Visit | Attending: Physician Assistant | Admitting: Physician Assistant

## 2021-07-11 DIAGNOSIS — M546 Pain in thoracic spine: Secondary | ICD-10-CM

## 2021-07-11 DIAGNOSIS — M545 Low back pain, unspecified: Secondary | ICD-10-CM

## 2021-11-08 ENCOUNTER — Emergency Department (HOSPITAL_COMMUNITY): Payer: Medicaid Other

## 2021-11-08 ENCOUNTER — Encounter (HOSPITAL_COMMUNITY): Payer: Self-pay | Admitting: Emergency Medicine

## 2021-11-08 ENCOUNTER — Emergency Department (HOSPITAL_COMMUNITY)
Admission: EM | Admit: 2021-11-08 | Discharge: 2021-11-08 | Disposition: A | Discharge status: Home / Self Care | Payer: Medicaid Other | Attending: Emergency Medicine | Admitting: Emergency Medicine

## 2021-11-08 ENCOUNTER — Other Ambulatory Visit: Payer: Self-pay

## 2021-11-08 DIAGNOSIS — R059 Cough, unspecified: Secondary | ICD-10-CM | POA: Diagnosis not present

## 2021-11-08 DIAGNOSIS — E119 Type 2 diabetes mellitus without complications: Secondary | ICD-10-CM | POA: Insufficient documentation

## 2021-11-08 DIAGNOSIS — Z7984 Long term (current) use of oral hypoglycemic drugs: Secondary | ICD-10-CM | POA: Insufficient documentation

## 2021-11-08 DIAGNOSIS — R079 Chest pain, unspecified: Secondary | ICD-10-CM | POA: Insufficient documentation

## 2021-11-08 DIAGNOSIS — R5383 Other fatigue: Secondary | ICD-10-CM | POA: Insufficient documentation

## 2021-11-08 DIAGNOSIS — F17210 Nicotine dependence, cigarettes, uncomplicated: Secondary | ICD-10-CM | POA: Diagnosis not present

## 2021-11-08 DIAGNOSIS — R110 Nausea: Secondary | ICD-10-CM | POA: Insufficient documentation

## 2021-11-08 DIAGNOSIS — R197 Diarrhea, unspecified: Secondary | ICD-10-CM | POA: Diagnosis not present

## 2021-11-08 DIAGNOSIS — Z79899 Other long term (current) drug therapy: Secondary | ICD-10-CM | POA: Diagnosis not present

## 2021-11-08 DIAGNOSIS — I10 Essential (primary) hypertension: Secondary | ICD-10-CM | POA: Insufficient documentation

## 2021-11-08 HISTORY — DX: Unspecified asthma, uncomplicated: J45.909

## 2021-11-08 HISTORY — DX: Pure hypercholesterolemia, unspecified: E78.00

## 2021-11-08 LAB — COMPREHENSIVE METABOLIC PANEL
ALT: 22 U/L (ref 0–44)
AST: 16 U/L (ref 15–41)
Albumin: 4 g/dL (ref 3.5–5.0)
Alkaline Phosphatase: 59 U/L (ref 38–126)
Anion gap: 6 (ref 5–15)
BUN: 12 mg/dL (ref 6–20)
CO2: 25 mmol/L (ref 22–32)
Calcium: 8.8 mg/dL — ABNORMAL LOW (ref 8.9–10.3)
Chloride: 105 mmol/L (ref 98–111)
Creatinine, Ser: 0.61 mg/dL (ref 0.44–1.00)
GFR, Estimated: >60 mL/min (ref >60)
Glucose, Bld: 108 mg/dL — ABNORMAL HIGH (ref 70–99)
Potassium: 4.3 mmol/L (ref 3.5–5.1)
Sodium: 136 mmol/L (ref 135–145)
Total Bilirubin: 0.2 mg/dL — ABNORMAL LOW (ref 0.3–1.2)
Total Protein: 6.9 g/dL (ref 6.5–8.1)

## 2021-11-08 LAB — URINALYSIS, ROUTINE W REFLEX MICROSCOPIC
Bilirubin Urine: NEGATIVE
Glucose, UA: >=500 mg/dL — AB
Hgb urine dipstick: NEGATIVE
Ketones, ur: NEGATIVE mg/dL
Leukocytes,Ua: NEGATIVE
Nitrite: NEGATIVE
Protein, ur: NEGATIVE mg/dL
Specific Gravity, Urine: 1.021 (ref 1.005–1.030)
pH: 6 (ref 5.0–8.0)

## 2021-11-08 LAB — PREGNANCY, URINE: Preg Test, Ur: NEGATIVE

## 2021-11-08 LAB — CBC WITH DIFFERENTIAL/PLATELET
Abs Immature Granulocytes: 0.03 10*3/uL (ref 0.00–0.07)
Basophils Absolute: 0 10*3/uL (ref 0.0–0.1)
Basophils Relative: 1 %
Eosinophils Absolute: 0.1 10*3/uL (ref 0.0–0.5)
Eosinophils Relative: 2 %
HCT: 45.9 % (ref 36.0–46.0)
Hemoglobin: 15.7 g/dL — ABNORMAL HIGH (ref 12.0–15.0)
Immature Granulocytes: 0 %
Lymphocytes Relative: 23 %
Lymphs Abs: 1.9 10*3/uL (ref 0.7–4.0)
MCH: 29.7 pg (ref 26.0–34.0)
MCHC: 34.2 g/dL (ref 30.0–36.0)
MCV: 86.8 fL (ref 80.0–100.0)
Monocytes Absolute: 0.4 10*3/uL (ref 0.1–1.0)
Monocytes Relative: 5 %
Neutro Abs: 5.7 10*3/uL (ref 1.7–7.7)
Neutrophils Relative %: 69 %
Platelets: 240 10*3/uL (ref 150–400)
RBC: 5.29 MIL/uL — ABNORMAL HIGH (ref 3.87–5.11)
RDW: 12.5 % (ref 11.5–15.5)
WBC: 8.2 10*3/uL (ref 4.0–10.5)
nRBC: 0 % (ref 0.0–0.2)

## 2021-11-08 LAB — MAGNESIUM: Magnesium: 2.1 mg/dL (ref 1.7–2.4)

## 2021-11-08 LAB — LACTIC ACID, PLASMA: Lactic Acid, Venous: 1.5 mmol/L (ref 0.5–1.9)

## 2021-11-08 LAB — TROPONIN I (HIGH SENSITIVITY): Troponin I (High Sensitivity): <2 ng/L (ref <18)

## 2021-11-08 MED ORDER — ONDANSETRON 4 MG PO TBDP: 4.0000 mg | ORAL_TABLET | Freq: Three times a day (TID) | ORAL | 0 refills | Status: DC | PRN

## 2021-11-08 NOTE — ED Notes (Signed)
Pt transported for DG chest 2 view

## 2021-11-08 NOTE — ED Triage Notes (Signed)
Patient c/o central chest pain without radiation x2 weeks. Per patient shortness of breath, fatigue and lightheadedness. Denies any nausea, vomiting, or diarrhea. Per patient hx of heart ablation. Patient also reports diarrhea and chills that started this morning. Denies any known fever.

## 2021-11-08 NOTE — ED Notes (Signed)
Patient requesting work note. EDP made aware.

## 2021-11-08 NOTE — ED Provider Notes (Signed)
[facility] Provider Note   CSN: [id number] Arrival date & time: 11/08/21  0840     History  Chief Complaint  Patient presents with   Chest Pain    Valerie [name] is a 41 y.o. female.   Chest Pain  This patient is a 41 year old female, she has a history of hypertension for which she takes metoprolol, diabetes for which she takes metformin and Jardiance and high cholesterol for which she takes atorvastatin.  She smokes 1 pack of cigarettes a day and has a prior history of some type of supraventricular arrhythmia that required an ablation which was successfully done several years ago.  She does not take any anticoagulants.  She does take alprazolam which she states she is trying to taper herself off of and she takes chronic pain medications including Percocet several times a day because of chronic knee and ankle and leg pain.  She reports that over the last couple of weeks she has had a general feeling of chest pain which comes and goes throughout the day, last about 2 or 3 seconds and then goes away, it comes on spontaneously at random times during the day and is not associated with any radiation of this pain to the neck jaw or back or arms.  She has no swelling of the legs, no fevers or chills, she is not feeling particularly short of breath but has had a little bit of coughing and phlegm this morning.  She is a chronic smoker smoking 1 pack of cigarettes per day.  Today she has felt slightly nauseated, she has had diarrhea and felt generally fatigued.  Home Medications Prior to Admission medications   Medication Sig Start Date End Date Taking? Authorizing Provider  albuterol (PROVENTIL HFA;VENTOLIN HFA) 108 (90 BASE) MCG/ACT inhaler Inhale 1-2 puffs into the lungs every 6 (six) hours as needed for wheezing or shortness of breath. 06/26/14  Yes Neese, Daniels M, NP  ALPRAZolam Duanne Moron) 1 MG tablet Take 1 mg by mouth 3 (three) times daily as needed for anxiety.   Yes  [provider]  atorvastatin (LIPITOR) 10 MG tablet Take 10 mg by mouth daily.   Yes [provider]  gabapentin (NEURONTIN) 300 MG capsule Take 1 capsule by mouth 3 (three) times daily. 09/18/20  Yes [provider]  metFORMIN (GLUCOPHAGE) 1000 MG tablet Take 500 mg by mouth 4 (four) times daily.   Yes [provider]  metoprolol succinate (TOPROL-XL) 25 MG 24 hr tablet Take 2 tablets (50 mg total) by mouth every morning. Patient taking differently: Take 50 mg by mouth daily. 09/25/20  Yes Verta Ellen., NP  ondansetron (ZOFRAN-ODT) 4 MG disintegrating tablet Take 1 tablet (4 mg total) by mouth every 8 (eight) hours as needed for nausea. 11/08/21  Yes Noemi Chapel, MD  oxyCODONE-acetaminophen (PERCOCET) 10-325 MG tablet Take 1 tablet by mouth 4 (four) times daily as needed. 09/21/20  Yes [provider]      Allergies    Patient has no active allergies.    Review of Systems   Review of Systems  Cardiovascular:  Positive for chest pain.  All other systems reviewed and are negative.  Physical Exam Updated Vital Signs BP 115/88   Pulse 79   Temp 98.6 F (37 C) (Oral)   Resp 17   Ht 1.702 m ('5\' 7"'$ )   Wt 93 kg   LMP 03/09/2018   SpO2 97%   BMI 32.11 kg/m  Physical Exam  Vitals and nursing note reviewed.  Constitutional:      General: She is not in acute distress.    Appearance: She is well-developed.  HENT:     Head: Normocephalic and atraumatic.     Mouth/Throat:     Pharynx: No oropharyngeal exudate.  Eyes:     General: No scleral icterus.       Right eye: No discharge.        Left eye: No discharge.     Conjunctiva/sclera: Conjunctivae normal.     Pupils: Pupils are equal, round, and reactive to light.  Neck:     Thyroid: No thyromegaly.     Vascular: No JVD.  Cardiovascular:     Rate and Rhythm: Normal rate and regular rhythm.     Heart sounds: Normal heart sounds. No murmur heard.   No friction rub. No gallop.   Pulmonary:     Effort: Pulmonary effort is normal. No respiratory distress.     Breath sounds: Normal breath sounds. No wheezing or rales.  Abdominal:     General: Bowel sounds are normal. There is no distension.     Palpations: Abdomen is soft. There is no mass.     Tenderness: There is no abdominal tenderness.  Musculoskeletal:        General: No tenderness. Normal range of motion.     Cervical back: Normal range of motion and neck supple.     Right lower leg: No edema.     Left lower leg: No edema.  Lymphadenopathy:     Cervical: No cervical adenopathy.  Skin:    General: Skin is warm and dry.     Findings: No erythema or rash.  Neurological:     Mental Status: She is alert.     Coordination: Coordination normal.  Psychiatric:        Behavior: Behavior normal.    ED Results / Procedures / Treatments   Labs (all labs ordered are listed, but only abnormal results are displayed) Labs Reviewed  COMPREHENSIVE METABOLIC PANEL - Abnormal; Notable for the following components:      Result Value   Glucose, Bld 108 (*)    Calcium 8.8 (*)    Total Bilirubin 0.2 (*)    All other components within normal limits  CBC WITH DIFFERENTIAL/PLATELET - Abnormal; Notable for the following components:   RBC 5.29 (*)    Hemoglobin 15.7 (*)    All other components within normal limits  URINALYSIS, ROUTINE W REFLEX MICROSCOPIC - Abnormal; Notable for the following components:   Glucose, UA >=500 (*)    Bacteria, UA RARE (*)    All other components within normal limits  PREGNANCY, URINE  MAGNESIUM  LACTIC ACID, PLASMA  CBG MONITORING, ED  TROPONIN I (HIGH SENSITIVITY)    EKG EKG Interpretation  Date/Time:  Monday Nov 08 2021 09:05:43 EDT Ventricular Rate:  78 PR Interval:  167 QRS Duration: 95 QT Interval:  388 QTC Calculation: 442 R Axis:   80 Text Interpretation: Sinus rhythm ST elev, probable normal early repol pattern since last tracing no significant change Confirmed by  Noemi Chapel [phone]) on 11/08/2021 10:21:57 AM  Radiology DG Chest 2 View  Result Date: 11/08/2021 CLINICAL DATA:  Chest pain EXAM: CHEST - 2 VIEW COMPARISON:  06/26/2014 FINDINGS: Artifact from EKG leads. Normal heart size and mediastinal contours. No acute infiltrate or edema. No effusion or pneumothorax. No acute osseous findings. IMPRESSION: Negative chest. Electronically Signed   By: Gilford Silvius.D.  On: 11/08/2021 09:37    Procedures Procedures    Medications Ordered in ED Medications - No data to display  ED Course/ Medical Decision Making/ A&P                           Medical Decision Making Amount and/or Complexity of Data Reviewed Labs: ordered. Radiology: ordered. ECG/medicine tests: ordered.  Risk Prescription drug management.   This patient presents to the ED for concern of multiple symptoms including chest pain generalized weakness and diarrhea, this involves an extensive number of treatment options, and is a complaint that carries with it a high risk of complications and morbidity.  The differential diagnosis includes viral syndrome, pleurisy, pneumonia, pneumothorax, coronary syndrome, that all seems less likely given her EKG which is unremarkable.  She is a chronic smoker, she may have some element of anemia electrolyte deficiency or hypomagnesemia.   Co morbidities that complicate the patient evaluation  Hypertension diabetes cholesterol tobacco use   Additional history obtained:  Additional history obtained from electronic medical record External records from outside source obtained and reviewed including prior cardiology evaluation and interventions, including last cardiology visit which was in 2020 after she was seen for SVT which is what the ablation was for   Lab Tests:  I Ordered, and personally interpreted labs.  The pertinent results include: Metabolic panel which was unremarkable, CBC without leukocytosis, troponin was negative, magnesium  was normal, lactate was normal and the urinalysis was unremarkable as well.  She is not pregnant   Imaging Studies ordered:  I ordered imaging studies including 2 view chest x-ray I independently visualized and interpreted imaging which showed no acute findings to suggest a cause for the patient's chest pain I agree with the radiologist interpretation   Cardiac Monitoring: / EKG:  The patient was maintained on a cardiac monitor.  I personally viewed and interpreted the cardiac monitored which showed an underlying rhythm of: Normal sinus rhythm   Consultations Obtained:  No consultations needed   Problem List / ED Course / Critical interventions / Medication management  The patient improved significantly, she had ongoing mild symptoms, ultimately she was informed of all of her results and expressed understanding I have reviewed the patients home medicines and have made adjustments as needed   Social Determinants of Health:  None   Test / Admission - Considered:  The patient does not need to be admitted to the hospital, vital signs remain normal, she is stable for discharge, doubt acute coronary syndrome or other acute cardiothoracic pathology causing her chest pain         Final Clinical Impression(s) / ED Diagnoses Final diagnoses:  Chest pain, unspecified type    Rx / DC Orders ED Discharge Orders          Ordered    ondansetron (ZOFRAN-ODT) 4 MG disintegrating tablet  Every 8 hours PRN        11/08/21 1202              Noemi Chapel, MD 11/08/21 1229

## 2021-11-08 NOTE — Discharge Instructions (Signed)
Your testing was reassuring, no acute findings were seen, please drink plenty of clear liquids and rest for the next 24 hours  Imodium as needed for diarrhea  Zofran as needed for nausea  Zofran is a medication which can help with nausea.  You may take 4 mg by mouth every 6 hours as needed if you are an adult, if your child under the age of 6 take half of a tablet or 2 mg every 6 hours as needed.  This should dissolve on your tongue within a short timeframe.  Wait about 30 minutes after taking it to help with drinking clear liquids.  Insert follow-up

## 2021-12-31 ENCOUNTER — Other Ambulatory Visit: Payer: Self-pay | Admitting: Physician Assistant

## 2021-12-31 DIAGNOSIS — Z1231 Encounter for screening mammogram for malignant neoplasm of breast: Secondary | ICD-10-CM

## 2022-06-06 DIAGNOSIS — Z20822 Contact with and (suspected) exposure to covid-19: Secondary | ICD-10-CM | POA: Insufficient documentation

## 2022-06-06 DIAGNOSIS — J101 Influenza due to other identified influenza virus with other respiratory manifestations: Secondary | ICD-10-CM | POA: Diagnosis not present

## 2022-06-06 DIAGNOSIS — R509 Fever, unspecified: Secondary | ICD-10-CM | POA: Diagnosis present

## 2022-06-07 ENCOUNTER — Emergency Department (HOSPITAL_COMMUNITY)
Admission: EM | Admit: 2022-06-07 | Discharge: 2022-06-07 | Disposition: A | Discharge status: Home / Self Care | Payer: Medicaid Other | Attending: Emergency Medicine | Admitting: Emergency Medicine

## 2022-06-07 ENCOUNTER — Encounter (HOSPITAL_COMMUNITY): Payer: Self-pay | Admitting: Emergency Medicine

## 2022-06-07 ENCOUNTER — Other Ambulatory Visit: Payer: Self-pay

## 2022-06-07 DIAGNOSIS — J111 Influenza due to unidentified influenza virus with other respiratory manifestations: Secondary | ICD-10-CM

## 2022-06-07 LAB — RESP PANEL BY RT-PCR (RSV, FLU A&B, COVID)  RVPGX2
Influenza A by PCR: POSITIVE — AB
Influenza B by PCR: NEGATIVE
Resp Syncytial Virus by PCR: NEGATIVE
SARS Coronavirus 2 by RT PCR: NEGATIVE

## 2022-06-07 MED ORDER — ALBUTEROL SULFATE HFA 108 (90 BASE) MCG/ACT IN AERS
2.0000 | INHALATION_SPRAY | RESPIRATORY_TRACT | Status: DC | PRN
  Administered 2022-06-07: 2 via RESPIRATORY_TRACT
  Filled 2022-06-07: qty 6.7

## 2022-06-07 MED ORDER — ACETAMINOPHEN 325 MG PO TABS
650.0000 mg | ORAL_TABLET | Freq: Once | ORAL | Status: AC
  Administered 2022-06-07: 650 mg via ORAL
  Filled 2022-06-07: qty 2

## 2022-06-07 NOTE — ED Provider Notes (Signed)
[facility] EMERGENCY DEPARTMENT  Provider Note  CSN: [id number] Arrival date & time: 06/06/22 2316  History Chief Complaint  Patient presents with   Fever    Valerie [name] is a 41 y.o. female with history of bronchitis here with 1 day of headache, cough, wheezing and fever.    Home Medications Prior to Admission medications   Medication Sig Start Date End Date Taking? Authorizing Provider  albuterol (PROVENTIL HFA;VENTOLIN HFA) 108 (90 BASE) MCG/ACT inhaler Inhale 1-2 puffs into the lungs every 6 (six) hours as needed for wheezing or shortness of breath. 06/26/14   Ashley Murrain, NP  ALPRAZolam Duanne Moron) 1 MG tablet Take 1 mg by mouth 3 (three) times daily as needed for anxiety.    [provider]  atorvastatin (LIPITOR) 10 MG tablet Take 10 mg by mouth daily.    [provider]  gabapentin (NEURONTIN) 300 MG capsule Take 1 capsule by mouth 3 (three) times daily. 09/18/20   [provider]  metFORMIN (GLUCOPHAGE) 1000 MG tablet Take 500 mg by mouth 4 (four) times daily.    [provider]  metoprolol succinate (TOPROL-XL) 25 MG 24 hr tablet Take 2 tablets (50 mg total) by mouth every morning. Patient taking differently: Take 50 mg by mouth daily. 09/25/20   Verta Ellen., NP  ondansetron (ZOFRAN-ODT) 4 MG disintegrating tablet Take 1 tablet (4 mg total) by mouth every 8 (eight) hours as needed for nausea. 11/08/21   Noemi Chapel, MD  oxyCODONE-acetaminophen (PERCOCET) 10-325 MG tablet Take 1 tablet by mouth 4 (four) times daily as needed. 09/21/20   [provider]     Allergies    Patient has no known allergies.   Review of Systems   Review of Systems Please see HPI for pertinent positives and negatives  Physical Exam BP 135/78 (BP Location: Right Arm)   Pulse (!) 113   Temp (!) 102.1 F (38.9 C) (Oral)   Resp 20   Ht '5\' 7"'$  (1.702 m)   Wt 93 kg   LMP 03/09/2018   SpO2 92%   BMI 32.11 kg/m   Physical Exam Vitals  and nursing note reviewed.  Constitutional:      Appearance: Normal appearance.  HENT:     Head: Normocephalic and atraumatic.     Nose: Nose normal.     Mouth/Throat:     Mouth: Mucous membranes are moist.  Eyes:     Extraocular Movements: Extraocular movements intact.     Conjunctiva/sclera: Conjunctivae normal.  Cardiovascular:     Rate and Rhythm: Normal rate.  Pulmonary:     Effort: Pulmonary effort is normal.     Breath sounds: Wheezing (faint end expiratory) present.  Abdominal:     General: Abdomen is flat.     Palpations: Abdomen is soft.     Tenderness: There is no abdominal tenderness.  Musculoskeletal:        General: No swelling. Normal range of motion.     Cervical back: Neck supple.  Skin:    General: Skin is warm and dry.  Neurological:     General: No focal deficit present.     Mental Status: She is alert.  Psychiatric:        Mood and Affect: Mood normal.     ED Results / Procedures / Treatments   EKG None  Procedures Procedures  Medications Ordered in the ED Medications  acetaminophen (TYLENOL) tablet 650 mg (has no administration in time range)  albuterol (VENTOLIN HFA) 108 (90 Base) MCG/ACT inhaler 2 puff (has no administration in time range)    Initial Impression and Plan  Patient here with URI and positive flu test. Requesting inhaler as hers is almost out. Otherwise recommend symptomatic care and isolation. RTED for any worsening.   ED Course       MDM Rules/Calculators/A&P Medical Decision Making Problems Addressed: Influenza: acute illness or injury  Amount and/or Complexity of Data Reviewed Labs: ordered. Decision-making details documented in ED Course.  Risk OTC drugs. Prescription drug management.    Final Clinical Impression(s) / ED Diagnoses Final diagnoses:  Influenza    Rx / DC Orders ED Discharge Orders     None        Truddie Hidden, MD 06/07/22 [phone]

## 2022-06-07 NOTE — ED Triage Notes (Signed)
Pt c/o fever, cough, and headache since this morning. Denies sick exposures.

## 2022-08-01 ENCOUNTER — Telehealth: Payer: Self-pay | Admitting: Orthopaedic Surgery

## 2022-08-01 NOTE — Telephone Encounter (Signed)
Returned the patient's call, lvm for her to call us back.

## 2022-08-11 ENCOUNTER — Encounter: Payer: Self-pay | Admitting: Radiology

## 2022-08-18 ENCOUNTER — Encounter: Payer: Self-pay | Admitting: Orthopaedic Surgery

## 2022-08-18 ENCOUNTER — Ambulatory Visit: Payer: Medicaid Other | Admitting: Orthopaedic Surgery

## 2022-08-18 ENCOUNTER — Ambulatory Visit (INDEPENDENT_AMBULATORY_CARE_PROVIDER_SITE_OTHER): Payer: Medicaid Other

## 2022-08-18 VITALS — Ht 66.5 in | Wt 205.0 lb

## 2022-08-18 DIAGNOSIS — M25561 Pain in right knee: Secondary | ICD-10-CM

## 2022-08-18 DIAGNOSIS — M2241 Chondromalacia patellae, right knee: Secondary | ICD-10-CM

## 2022-08-18 DIAGNOSIS — R200 Anesthesia of skin: Secondary | ICD-10-CM

## 2022-08-18 DIAGNOSIS — G8929 Other chronic pain: Secondary | ICD-10-CM

## 2022-08-18 NOTE — Progress Notes (Addendum)
Office Visit Note   Patient: Valerie [name]           Date of Birth: Jun 15, 1980           MRN: [id number] Visit Date: 08/18/2022              Requested by: [name], PA-C [address] [place],  [place] [address] PCP: [name], PA-C   Assessment & Plan: Visit Diagnoses:  1. Chronic pain of right knee   2. Bilateral hand numbness   3. Chondromalacia patellae, right knee   4. Anterior knee pain, right     Plan: Will proceed with nerve conduction study bilateral carpal tunnel syndrome.  She uses splints at night.  She has had persistent knee problems we can consider further imaging.  Reviewed plain radiographs which were unremarkable other than suggestion of old MCL injury.  Follow-Up Instructions: No follow-ups on file.   Orders:  Orders Placed This Encounter  Procedures   Large Joint Inj   XR KNEE 3 VIEW RIGHT   Ambulatory referral to Physical Medicine Rehab   No orders of the defined types were placed in this encounter.     Procedures: Large Joint Inj: R knee on 08/19/2022 4:35 PM Indications: pain and joint swelling Details: 22 G 1.5 in needle, anterolateral approach  Arthrogram: No  Medications: 40 mg methylPREDNISolone acetate 40 MG/ML; 0.5 mL lidocaine 1 %; 4 mL bupivacaine 0.25 % Outcome: tolerated well, no immediate complications Procedure, treatment alternatives, risks and benefits explained, specific risks discussed. Consent was given by the patient. Immediately prior to procedure a time out was called to verify the correct patient, procedure, equipment, support staff and site/side marked as required. Patient was prepped and draped in the usual sterile fashion.       Clinical Data: No additional findings.   Subjective: Chief Complaint  Patient presents with   Right Knee - Pain   Right Hand - Numbness, Pain   Left Hand - Numbness, Pain    HPI 42 year old female seen with progressive bilateral hand numbness and tingling wakes her up at night  causes her to drop objects and also problems with right knee pain.  She has been ambulatory with limping, has problems with stairs has noted some swelling in her knee and some sharp grabbing.  No true locking.  Patient takes Jardiance Lipitor.  She uses some Xanax for anxiety and was on gabapentin.  Review of Systems all systems noncontributory HPI.   Objective: Vital Signs: Ht 5' 6.5" (1.689 m)   Wt 205 lb (93 kg)   LMP 03/09/2018   BMI 32.59 kg/m   Physical Exam Constitutional:      Appearance: She is well-developed.  HENT:     Head: Normocephalic.     Right Ear: External ear normal.     Left Ear: External ear normal. There is no impacted cerumen.  Eyes:     Pupils: Pupils are equal, round, and reactive to light.  Neck:     Thyroid: No thyromegaly.     Trachea: No tracheal deviation.  Cardiovascular:     Rate and Rhythm: Normal rate.  Pulmonary:     Effort: Pulmonary effort is normal.  Abdominal:     Palpations: Abdomen is soft.  Musculoskeletal:     Cervical back: No rigidity.  Skin:    General: Skin is warm and dry.  Neurological:     Mental Status: She is alert and oriented to person, place, and time.  Psychiatric:        Behavior: Behavior normal.     Ortho Exam Phalen's at the wrist.  Positive carpal compression test no thenar atrophy.  Minimal brachial plexus tenderness good cervical rotation lateral tilting flexion extension.  Mild symmetrical brachial plexus tenderness.  She can rapidly rotate her neck right and left with minimal discomfort.  Positive patellar grind test.  2+ knee effusion right knee negative on the left knee.  Negative patellar subluxation.  Specialty Comments:  No specialty comments available.  Imaging: No results found.   PMFS History: Patient Active Problem List   Diagnosis Date Noted   Bilateral hand numbness 08/19/2022   Chondromalacia patellae, right knee 12/26/2019   Anterior knee pain, right 08/26/2019   Past Medical  History:  Diagnosis Date   Anxiety    Asthma    Bacterial vaginosis    Bronchitis    Diabetes (HCC)    High cholesterol    Overweight    SVT (supraventricular tachycardia)     Family History  Problem Relation Age of Onset   Diabetes Mother    Hypertension Mother    Diabetes Other    Hypertension Other     Past Surgical History:  Procedure Laterality Date   CESAREAN SECTION     INDUCED ABORTION     SVT ABLATION N/A 12/06/2018   Procedure: SVT ABLATION;  Surgeon: Hillis Range, MD;  Location: [facility];  Service: Cardiovascular;  Laterality: N/A;   TONSILLECTOMY     TUBAL LIGATION     Social History   Occupational History   Not on file  Tobacco Use   Smoking status: Every Day    Packs/day: 0.50    Types: Cigarettes   Smokeless tobacco: Never   Tobacco comments:    almost 1 pack daily  Vaping Use   Vaping Use: Never used  Substance and Sexual Activity   Alcohol use: Yes    Comment: rare   Drug use: No   Sexual activity: Never    Birth control/protection: Surgical

## 2022-08-19 DIAGNOSIS — M2241 Chondromalacia patellae, right knee: Secondary | ICD-10-CM | POA: Diagnosis not present

## 2022-08-19 DIAGNOSIS — R200 Anesthesia of skin: Secondary | ICD-10-CM | POA: Insufficient documentation

## 2022-08-19 MED ORDER — METHYLPREDNISOLONE ACETATE 40 MG/ML IJ SUSP
40.0000 mg | INTRAMUSCULAR | Status: AC | PRN
  Administered 2022-08-19: 40 mg via INTRA_ARTICULAR

## 2022-08-19 MED ORDER — LIDOCAINE HCL 1 % IJ SOLN
0.5000 mL | INTRAMUSCULAR | Status: AC | PRN
  Administered 2022-08-19: .5 mL

## 2022-08-19 MED ORDER — BUPIVACAINE HCL 0.25 % IJ SOLN
4.0000 mL | INTRAMUSCULAR | Status: AC | PRN
  Administered 2022-08-19: 4 mL via INTRA_ARTICULAR

## 2022-08-29 ENCOUNTER — Telehealth: Payer: Self-pay | Admitting: Physical Medicine and Rehabilitation

## 2022-08-29 NOTE — Telephone Encounter (Signed)
Pt returned call to [place] [facility] to set a referral appt. Pt phone number is [phone].

## 2022-08-30 NOTE — Telephone Encounter (Signed)
Spoke with patient and scheduled NCV for 09/21/22.

## 2022-09-21 ENCOUNTER — Telehealth: Payer: Self-pay | Admitting: Radiology

## 2022-09-21 ENCOUNTER — Ambulatory Visit (INDEPENDENT_AMBULATORY_CARE_PROVIDER_SITE_OTHER): Payer: Medicaid Other | Admitting: Physical Medicine and Rehabilitation

## 2022-09-21 DIAGNOSIS — R202 Paresthesia of skin: Secondary | ICD-10-CM | POA: Diagnosis not present

## 2022-09-21 DIAGNOSIS — M542 Cervicalgia: Secondary | ICD-10-CM

## 2022-09-21 DIAGNOSIS — R200 Anesthesia of skin: Secondary | ICD-10-CM | POA: Diagnosis not present

## 2022-09-21 DIAGNOSIS — M79642 Pain in left hand: Secondary | ICD-10-CM

## 2022-09-21 DIAGNOSIS — M79641 Pain in right hand: Secondary | ICD-10-CM | POA: Diagnosis not present

## 2022-09-21 NOTE — Telephone Encounter (Signed)
Patient called stating she had EMG/NCV this morning and is now experiencing neck pain, bilateral shoulder pain, and bilateral hand pain. She would like to know if you could call her in something for pain?  [facility] [address] in [place]  [place] [phone]

## 2022-09-21 NOTE — Progress Notes (Signed)
Patient having bilateral numbness and pain.

## 2022-09-22 ENCOUNTER — Telehealth: Payer: Self-pay | Admitting: Orthopaedic Surgery

## 2022-09-22 NOTE — Telephone Encounter (Signed)
Is there any update on patient nerve study she states she is taking the tylenol and ibuprofen and she is still in pain

## 2022-09-22 NOTE — Telephone Encounter (Signed)
Patient called [place] office and was advised Dr. Ophelia Charter is not sending in anything for pain. Advised we are currently waiting on results.

## 2022-09-22 NOTE — Telephone Encounter (Signed)
Patient advised. She states that they are not helping.  She called [place] office to ask about status of results. Message was sent to [facility] and myself.

## 2022-09-24 ENCOUNTER — Encounter: Payer: Self-pay | Admitting: Physical Medicine and Rehabilitation

## 2022-09-26 ENCOUNTER — Encounter: Payer: Self-pay | Admitting: Orthopaedic Surgery

## 2022-09-26 ENCOUNTER — Encounter: Payer: Self-pay | Admitting: Physical Medicine and Rehabilitation

## 2022-09-26 NOTE — Progress Notes (Signed)
Valerie [name] - 42 y.o. female MRN [id number]  Date of birth: 1981-05-17  Office Visit Note: Visit Date: 09/21/2022 PCP: [name], PA-C Referred by: Eldred Manges, MD  Subjective: Chief Complaint  Patient presents with   Right Hand - Numbness   Left Hand - Numbness   HPI:  Valerie [name] is a 42 y.o. female who comes in today at the request of Dr. Annell Greening for evaluation and management of chronic, worsening and severe pain, numbness and tingling in the Bilateral upper extremities.  Patient is Right hand dominant.  She reports a chronic history of bilateral hand pain with several months now of progressively worsening numbness tingling and a feeling of weakness in both hands.  The numbness really is in more of the radial digits but can be the whole hand.  She gets some referral pattern up into the arms.  She does endorse some neck pain but no frank radicular symptoms.  She currently takes gabapentin and oxycodone.  She does have a history of type 2 diabetes.  No history of known polyneuropathy.  No prior electrodiagnostic study.  Thoracic and lumbar images but no cervical imaging.   I spent more than 30 minutes speaking face-to-face with the patient with 50% of the time in counseling and discussing coordination of care.   Review of Systems  Musculoskeletal:  Positive for back pain and joint pain.  Neurological:  Positive for tingling.  All other systems reviewed and are negative.  Otherwise per HPI.  Assessment & Plan: Visit Diagnoses:    ICD-10-CM   1. Paresthesia of skin  R20.2 NCV with EMG (electromyography)    2. Bilateral hand pain  M79.641    M79.642     3. Bilateral hand numbness  R20.0     4. Cervicalgia  M54.2       Plan: Impression: Primarily clinical numbness in the hands and paresthesia consistent with carpal tunnel syndrome or perhaps neuropathy.  She could have some hand pain from [facility] joint early arthritic change or tendinitis.  Electrodiagnostic  study performed.  The above electrodiagnostic study is ABNORMAL and reveals evidence of a severe bilateral median nerve entrapment at the wrist (carpal tunnel syndrome) affecting sensory and motor components.   There is no significant electrodiagnostic evidence of any other focal nerve entrapment, brachial plexopathy or cervical radiculopathy.  As you know, this particular electrodiagnostic study cannot rule out chemical radiculitis or sensory only radiculopathy.  Recommendations: 1.  Follow-up with referring physician. 2.  Continue current management of symptoms. 3.  Suggest surgical evaluation.  Meds & Orders: No orders of the defined types were placed in this encounter.   Orders Placed This Encounter  Procedures   NCV with EMG (electromyography)    Follow-up: Return for Annell Greening, MD.   Procedures: No procedures performed  EMG & NCV Findings: Evaluation of the left median motor and the right median motor nerves showed prolonged distal onset latency (L7.7, R6.8 ms) and decreased conduction velocity (Elbow-Wrist, L45, R47 m/s).  The left median (across palm) sensory nerve showed prolonged distal peak latency (Wrist, 6.7 ms) and reduced amplitude (3.3 V).  The right median (across palm) sensory nerve showed no response (Wrist) and prolonged distal peak latency (Palm, 3.6 ms).  The right ulnar sensory nerve showed prolonged distal peak latency (4.4 ms) and decreased conduction velocity (Wrist-5th Digit, 32 m/s).  All remaining nerves (as indicated in the following tables) were within normal limits.  Left vs. Right side comparison  data for the median motor nerve indicates abnormal L-R latency difference (0.9 ms).  The ulnar sensory nerve indicates abnormal L-R latency difference (1.2 ms).  All remaining left vs. right side differences were within normal limits.    All examined muscles (as indicated in the following table) showed no evidence of electrical instability.    Impression: The above  electrodiagnostic study is ABNORMAL and reveals evidence of a severe bilateral median nerve entrapment at the wrist (carpal tunnel syndrome) affecting sensory and motor components.   There is no significant electrodiagnostic evidence of any other focal nerve entrapment, brachial plexopathy or cervical radiculopathy.  As you know, this particular electrodiagnostic study cannot rule out chemical radiculitis or sensory only radiculopathy.  Recommendations: 1.  Follow-up with referring physician. 2.  Continue current management of symptoms. 3.  Suggest surgical evaluation.  ___________________________ Naaman Plummer FAAPMR Board Certified, American Board of Physical Medicine and Rehabilitation    Nerve Conduction Studies Anti Sensory Summary Table   Stim Site NR Peak (ms) Norm Peak (ms) P-T Amp (V) Norm P-T Amp Site1 Site2 Delta-P (ms) Dist (cm) Vel (m/s) Norm Vel (m/s)  Left Median Acr Palm Anti Sensory (2nd Digit)  31.2C  Wrist    *6.7 <3.6 *3.3 >10 Wrist Palm 5.2 0.0    Palm    1.5 <2.0 6.2         Right Median Acr Palm Anti Sensory (2nd Digit)  29.6C  Wrist *NR  <3.6  >10 Wrist Palm  0.0    Palm    *3.6 <2.0 10.6         Left Radial Anti Sensory (Base 1st Digit)  31.6C  Wrist    1.9 <3.1 43.0  Wrist Base 1st Digit 1.9 0.0    Right Radial Anti Sensory (Base 1st Digit)  30.3C  Wrist    2.0 <3.1 37.9  Wrist Base 1st Digit 2.0 0.0    Left Ulnar Anti Sensory (5th Digit)  31.6C  Wrist    3.2 <3.7 39.4 >15.0 Wrist 5th Digit 3.2 14.0 44 >38  Right Ulnar Anti Sensory (5th Digit)  30.4C  Wrist    *4.4 <3.7 29.2 >15.0 Wrist 5th Digit 4.4 14.0 *32 >38   Motor Summary Table   Stim Site NR Onset (ms) Norm Onset (ms) O-P Amp (mV) Norm O-P Amp Site1 Site2 Delta-0 (ms) Dist (cm) Vel (m/s) Norm Vel (m/s)  Left Median Motor (Abd Poll Brev)  31.5C  Wrist    *7.7 <4.2 6.9 >5 Elbow Wrist 4.7 21.0 *45 >50  Elbow    12.4  6.5         Right Median Motor (Abd Poll Brev)  30.6C  Wrist    *6.8  <4.2 7.1 >5 Elbow Wrist 4.5 21.0 *47 >50  Elbow    11.3  6.7         Left Ulnar Motor (Abd Dig Min)  31.5C  Wrist    3.1 <4.2 9.9 >3 B Elbow Wrist 3.3 20.0 61 >53  B Elbow    6.4  9.5  A Elbow B Elbow 1.1 10.0 91 >53  A Elbow    7.5  9.3         Right Ulnar Motor (Abd Dig Min)  30.9C  Wrist    3.0 <4.2 9.1 >3 B Elbow Wrist 3.3 20.0 61 >53  B Elbow    6.3  8.8  A Elbow B Elbow 1.0 9.0 90 >53  A Elbow    7.3  8.6  EMG   Side Muscle Nerve Root Ins Act Fibs Psw Amp Dur Poly Recrt Int Dennie Bible Comment  Right Abd Poll Brev Median C8-T1 Nml Nml Nml Nml Nml 0 Nml Nml   Right 1stDorInt Ulnar C8-T1 Nml Nml Nml Nml Nml 0 Nml Nml   Right PronatorTeres Median C6-7 Nml Nml Nml Nml Nml 0 Nml Nml   Right Biceps Musculocut C5-6 Nml Nml Nml Nml Nml 0 Nml Nml   Right Deltoid Axillary C5-6 Nml Nml Nml Nml Nml 0 Nml Nml     Nerve Conduction Studies Anti Sensory Left/Right Comparison   Stim Site L Lat (ms) R Lat (ms) L-R Lat (ms) L Amp (V) R Amp (V) L-R Amp (%) Site1 Site2 L Vel (m/s) R Vel (m/s) L-R Vel (m/s)  Median Acr Palm Anti Sensory (2nd Digit)  31.2C  Wrist *6.7   *3.3   Wrist Palm     Palm 1.5 *3.6 2.1 6.2 10.6 41.5       Radial Anti Sensory (Base 1st Digit)  31.6C  Wrist 1.9 2.0 0.1 43.0 37.9 11.9 Wrist Base 1st Digit     Ulnar Anti Sensory (5th Digit)  31.6C  Wrist 3.2 *4.4 *1.2 39.4 29.2 25.9 Wrist 5th Digit 44 *32 12   Motor Left/Right Comparison   Stim Site L Lat (ms) R Lat (ms) L-R Lat (ms) L Amp (mV) R Amp (mV) L-R Amp (%) Site1 Site2 L Vel (m/s) R Vel (m/s) L-R Vel (m/s)  Median Motor (Abd Poll Brev)  31.5C  Wrist *7.7 *6.8 *0.9 6.9 7.1 2.8 Elbow Wrist *45 *47 2  Elbow 12.4 11.3 1.1 6.5 6.7 3.0       Ulnar Motor (Abd Dig Min)  31.5C  Wrist 3.1 3.0 0.1 9.9 9.1 8.1 B Elbow Wrist 61 61 0  B Elbow 6.4 6.3 0.1 9.5 8.8 7.4 A Elbow B Elbow 91 90 1  A Elbow 7.5 7.3 0.2 9.3 8.6 7.5          Waveforms:                      Clinical History: No specialty comments  available.     Objective:  VS:  HT:    WT:   BMI:     BP:   HR: bpm  TEMP: ( )  RESP:  Physical Exam Vitals and nursing note reviewed.  Constitutional:      General: She is not in acute distress.    Appearance: Normal appearance. She is well-developed. She is not ill-appearing.  HENT:     Head: Normocephalic and atraumatic.  Eyes:     Conjunctiva/sclera: Conjunctivae normal.     Pupils: Pupils are equal, round, and reactive to light.  Cardiovascular:     Rate and Rhythm: Normal rate.     Pulses: Normal pulses.  Pulmonary:     Effort: Pulmonary effort is normal.  Musculoskeletal:        General: No swelling, tenderness or deformity.     Right lower leg: No edema.     Left lower leg: No edema.     Comments: Inspection reveals mild flattening of the bilateral APB but no atrophy of the bilateral FDI or hand intrinsics. There is no swelling, color changes, allodynia or dystrophic changes. There is 5 out of 5 strength in the bilateral wrist extension, finger abduction and long finger flexion. There is intact sensation to light touch in all dermatomal and peripheral nerve distributions. There is a  negative Tinel's test at the bilateral wrist and elbow. There is a positive Phalen's test bilaterally. There is a negative Hoffmann's test bilaterally.  Skin:    General: Skin is warm and dry.     Findings: No erythema or rash.  Neurological:     General: No focal deficit present.     Mental Status: She is alert and oriented to person, place, and time.     Sensory: No sensory deficit.     Motor: No weakness or abnormal muscle tone.     Coordination: Coordination normal.     Gait: Gait normal.  Psychiatric:        Mood and Affect: Mood normal.        Behavior: Behavior normal.      Imaging: No results found.

## 2022-09-26 NOTE — Procedures (Signed)
EMG & NCV Findings: Evaluation of the left median motor and the right median motor nerves showed prolonged distal onset latency (L7.7, R6.8 ms) and decreased conduction velocity (Elbow-Wrist, L45, R47 m/s).  The left median (across palm) sensory nerve showed prolonged distal peak latency (Wrist, 6.7 ms) and reduced amplitude (3.3 V).  The right median (across palm) sensory nerve showed no response (Wrist) and prolonged distal peak latency (Palm, 3.6 ms).  The right ulnar sensory nerve showed prolonged distal peak latency (4.4 ms) and decreased conduction velocity (Wrist-5th Digit, 32 m/s).  All remaining nerves (as indicated in the following tables) were within normal limits.  Left vs. Right side comparison data for the median motor nerve indicates abnormal L-R latency difference (0.9 ms).  The ulnar sensory nerve indicates abnormal L-R latency difference (1.2 ms).  All remaining left vs. right side differences were within normal limits.    All examined muscles (as indicated in the following table) showed no evidence of electrical instability.    Impression: The above electrodiagnostic study is ABNORMAL and reveals evidence of a severe bilateral median nerve entrapment at the wrist (carpal tunnel syndrome) affecting sensory and motor components.   There is no significant electrodiagnostic evidence of any other focal nerve entrapment, brachial plexopathy or cervical radiculopathy.  As you know, this particular electrodiagnostic study cannot rule out chemical radiculitis or sensory only radiculopathy.  Recommendations: 1.  Follow-up with referring physician. 2.  Continue current management of symptoms. 3.  Suggest surgical evaluation.  ___________________________ Valerie [name] FAAPMR Board Certified, American Board of Physical Medicine and Rehabilitation    Nerve Conduction Studies Anti Sensory Summary Table   Stim Site NR Peak (ms) Norm Peak (ms) P-T Amp (V) Norm P-T Amp Site1 Site2 Delta-P  (ms) Dist (cm) Vel (m/s) Norm Vel (m/s)  Left Median Acr Palm Anti Sensory (2nd Digit)  31.2C  Wrist    *6.7 <3.6 *3.3 >10 Wrist Palm 5.2 0.0    Palm    1.5 <2.0 6.2         Right Median Acr Palm Anti Sensory (2nd Digit)  29.6C  Wrist *NR  <3.6  >10 Wrist Palm  0.0    Palm    *3.6 <2.0 10.6         Left Radial Anti Sensory (Base 1st Digit)  31.6C  Wrist    1.9 <3.1 43.0  Wrist Base 1st Digit 1.9 0.0    Right Radial Anti Sensory (Base 1st Digit)  30.3C  Wrist    2.0 <3.1 37.9  Wrist Base 1st Digit 2.0 0.0    Left Ulnar Anti Sensory (5th Digit)  31.6C  Wrist    3.2 <3.7 39.4 >15.0 Wrist 5th Digit 3.2 14.0 44 >38  Right Ulnar Anti Sensory (5th Digit)  30.4C  Wrist    *4.4 <3.7 29.2 >15.0 Wrist 5th Digit 4.4 14.0 *32 >38   Motor Summary Table   Stim Site NR Onset (ms) Norm Onset (ms) O-P Amp (mV) Norm O-P Amp Site1 Site2 Delta-0 (ms) Dist (cm) Vel (m/s) Norm Vel (m/s)  Left Median Motor (Abd Poll Brev)  31.5C  Wrist    *7.7 <4.2 6.9 >5 Elbow Wrist 4.7 21.0 *45 >50  Elbow    12.4  6.5         Right Median Motor (Abd Poll Brev)  30.6C  Wrist    *6.8 <4.2 7.1 >5 Elbow Wrist 4.5 21.0 *47 >50  Elbow    11.3  6.7  Left Ulnar Motor (Abd Dig Min)  31.5C  Wrist    3.1 <4.2 9.9 >3 B Elbow Wrist 3.3 20.0 61 >53  B Elbow    6.4  9.5  A Elbow B Elbow 1.1 10.0 91 >53  A Elbow    7.5  9.3         Right Ulnar Motor (Abd Dig Min)  30.9C  Wrist    3.0 <4.2 9.1 >3 B Elbow Wrist 3.3 20.0 61 >53  B Elbow    6.3  8.8  A Elbow B Elbow 1.0 9.0 90 >53  A Elbow    7.3  8.6          EMG   Side Muscle Nerve Root Ins Act Fibs Psw Amp Dur Poly Recrt Int Dennie Bible Comment  Right Abd Poll Brev Median C8-T1 Nml Nml Nml Nml Nml 0 Nml Nml   Right 1stDorInt Ulnar C8-T1 Nml Nml Nml Nml Nml 0 Nml Nml   Right PronatorTeres Median C6-7 Nml Nml Nml Nml Nml 0 Nml Nml   Right Biceps Musculocut C5-6 Nml Nml Nml Nml Nml 0 Nml Nml   Right Deltoid Axillary C5-6 Nml Nml Nml Nml Nml 0 Nml Nml     Nerve Conduction  Studies Anti Sensory Left/Right Comparison   Stim Site L Lat (ms) R Lat (ms) L-R Lat (ms) L Amp (V) R Amp (V) L-R Amp (%) Site1 Site2 L Vel (m/s) R Vel (m/s) L-R Vel (m/s)  Median Acr Palm Anti Sensory (2nd Digit)  31.2C  Wrist *6.7   *3.3   Wrist Palm     Palm 1.5 *3.6 2.1 6.2 10.6 41.5       Radial Anti Sensory (Base 1st Digit)  31.6C  Wrist 1.9 2.0 0.1 43.0 37.9 11.9 Wrist Base 1st Digit     Ulnar Anti Sensory (5th Digit)  31.6C  Wrist 3.2 *4.4 *1.2 39.4 29.2 25.9 Wrist 5th Digit 44 *32 12   Motor Left/Right Comparison   Stim Site L Lat (ms) R Lat (ms) L-R Lat (ms) L Amp (mV) R Amp (mV) L-R Amp (%) Site1 Site2 L Vel (m/s) R Vel (m/s) L-R Vel (m/s)  Median Motor (Abd Poll Brev)  31.5C  Wrist *7.7 *6.8 *0.9 6.9 7.1 2.8 Elbow Wrist *45 *47 2  Elbow 12.4 11.3 1.1 6.5 6.7 3.0       Ulnar Motor (Abd Dig Min)  31.5C  Wrist 3.1 3.0 0.1 9.9 9.1 8.1 B Elbow Wrist 61 61 0  B Elbow 6.4 6.3 0.1 9.5 8.8 7.4 A Elbow B Elbow 91 90 1  A Elbow 7.5 7.3 0.2 9.3 8.6 7.5          Waveforms:

## 2022-09-27 ENCOUNTER — Other Ambulatory Visit: Payer: Self-pay | Admitting: Orthopaedic Surgery

## 2022-09-27 MED ORDER — TRAMADOL HCL 50 MG PO TABS
50.0000 mg | ORAL_TABLET | Freq: Four times a day (QID) | ORAL | 0 refills | Status: DC | PRN
Start: 2022-09-27 — End: 2022-10-02

## 2022-09-28 ENCOUNTER — Ambulatory Visit: Payer: Medicaid Other | Admitting: Orthopaedic Surgery

## 2022-09-29 ENCOUNTER — Ambulatory Visit: Payer: Medicaid Other | Admitting: Orthopaedic Surgery

## 2022-09-29 ENCOUNTER — Encounter: Payer: Self-pay | Admitting: Orthopaedic Surgery

## 2022-09-29 VITALS — Ht 67.0 in | Wt 196.0 lb

## 2022-09-29 DIAGNOSIS — R200 Anesthesia of skin: Secondary | ICD-10-CM | POA: Diagnosis not present

## 2022-09-29 DIAGNOSIS — G5602 Carpal tunnel syndrome, left upper limb: Secondary | ICD-10-CM | POA: Insufficient documentation

## 2022-09-29 DIAGNOSIS — G5603 Carpal tunnel syndrome, bilateral upper limbs: Secondary | ICD-10-CM

## 2022-09-29 NOTE — Progress Notes (Signed)
Office Visit Note   Patient: Valerie [name]           Date of Birth: [birth year]           MRN: [id number] Visit Date: 09/29/2022              Requested by: [name], PA-C [address] [place],  [place] [address] PCP: [name], PA-C   Assessment & Plan: Visit Diagnoses:  1. Bilateral hand numbness     Plan: Patient with severe bilateral carpal tunnel syndrome, we will do the right wrist first which is her request.  She understands she will be out several weeks after surgery and she can decide after the right wrist is done when she like to proceed with the left.  Follow-Up Instructions: No follow-ups on file.   Orders:  No orders of the defined types were placed in this encounter.  No orders of the defined types were placed in this encounter.     Procedures: No procedures performed   Clinical Data: No additional findings.   Subjective: Chief Complaint  Patient presents with   Neck - Pain   Right Hand - Pain   Left Hand - Pain    HPI 42 year old female returns with bilateral carpal tunnel syndrome and electrical test have just been completed which shows severe bilateral median nerve entrapment and the rest.  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  She has had to take some tramadol to get some relief and she like to have her right hand surgically treated first.  Review of Systems patient does have history of asthma not currently bothering her.  Type 2 diabetes With blood glucose levels over the last 10 years varying between 105 to only 1 reading over 200.  Negative for stroke or MI.   Objective: Vital Signs: Ht 5\' 7"  (1.702 m)   Wt 196 lb (88.9 kg)   LMP 03/09/2018   BMI 30.70 kg/m   Physical Exam Constitutional:      Appearance: She is well-developed.  HENT:     Head: Normocephalic.     Right Ear: External ear normal.     Left Ear: External ear normal. There is no impacted cerumen.  Eyes:     Pupils:  Pupils are equal, round, and reactive to light.  Neck:     Thyroid: No thyromegaly.     Trachea: No tracheal deviation.  Cardiovascular:     Rate and Rhythm: Normal rate.  Pulmonary:     Effort: Pulmonary effort is normal.  Abdominal:     Palpations: Abdomen is soft.  Musculoskeletal:     Cervical back: No rigidity.  Skin:    General: Skin is warm and dry.  Neurological:     Mental Status: She is alert and oriented to person, place, and time.  Psychiatric:        Behavior: Behavior normal.     Ortho Exam good cervical range of motion.  Positive Phalen's positive carpal compression test positive Tinel's both right and left wrist.  Ulnar nerve at the elbow is normal.  Normal heel-toe gait.  Specialty Comments:  atient Information  Name MRN Description  Veera L Mochizuki [id number] 42 y.o. female   Interpretation Summary  EMG & NCV Findings: Evaluation of the left median motor and the right median motor nerves showed prolonged distal onset latency (L7.7, R6.8 ms) and decreased conduction velocity (Elbow-Wrist, L45, R47 m/s).  The left median (across  palm) sensory nerve showed prolonged distal peak latency (Wrist, 6.7 ms) and reduced amplitude (3.3 V).  The right median (across palm) sensory nerve showed no response (Wrist) and prolonged distal peak latency (Palm, 3.6 ms).  The right ulnar sensory nerve showed prolonged distal peak latency (4.4 ms) and decreased conduction velocity (Wrist-5th Digit, 32 m/s).  All remaining nerves (as indicated in the following tables) were within normal limits.  Left vs. Right side comparison data for the median motor nerve indicates abnormal L-R latency difference (0.9 ms).  The ulnar sensory nerve indicates abnormal L-R latency difference (1.2 ms).  All remaining left vs. right side differences were within normal limits.     All examined muscles (as indicated in the following table) showed no evidence of electrical instability.      Impression: The above electrodiagnostic study is ABNORMAL and reveals evidence of a severe bilateral median nerve entrapment at the wrist (carpal tunnel syndrome) affecting sensory and motor components.    There is no significant electrodiagnostic evidence of any other focal nerve entrapment, brachial plexopathy or cervical radiculopathy.  As you know, this particular electrodiagnostic study cannot rule out chemical radiculitis or sensory only radiculopathy.   Recommendations: 1.  Follow-up with referring physician. 2.  Continue current management of symptoms. 3.  Suggest surgical evaluation.   ___________________________ Elease Hashimoto Board Certified, American Board of Physical Medicine and Rehabilitation      Imaging: No results found.   PMFS History: Patient Active Problem List   Diagnosis Date Noted   Bilateral hand numbness 08/19/2022   Chondromalacia patellae, right knee 12/26/2019   Lipoma of left shoulder 10/17/2019   Anterior knee pain, right 08/26/2019   Asthma 06/15/2016   Type 2 diabetes mellitus 06/15/2016   Other abnormal glucose 06/28/2015   Hidradenitis 06/28/2015   Past Medical History:  Diagnosis Date   Anxiety    Asthma    Bacterial vaginosis    Bronchitis    Diabetes    High cholesterol    Overweight    SVT (supraventricular tachycardia)     Family History  Problem Relation Age of Onset   Diabetes Mother    Hypertension Mother    Diabetes Other    Hypertension Other     Past Surgical History:  Procedure Laterality Date   CESAREAN SECTION     INDUCED ABORTION     SVT ABLATION N/A 12/06/2018   Procedure: SVT ABLATION;  Surgeon: Hillis Range, MD;  Location: [facility];  Service: Cardiovascular;  Laterality: N/A;   TONSILLECTOMY     TUBAL LIGATION     Social History   Occupational History   Not on file  Tobacco Use   Smoking status: Every Day    Packs/day: .5    Types: Cigarettes   Smokeless tobacco: Never   Tobacco  comments:    almost 1 pack daily  Vaping Use   Vaping Use: Never used  Substance and Sexual Activity   Alcohol use: Yes    Comment: rare   Drug use: No   Sexual activity: Never    Birth control/protection: Surgical

## 2022-10-02 ENCOUNTER — Other Ambulatory Visit: Payer: Self-pay | Admitting: Orthopaedic Surgery

## 2022-10-03 MED ORDER — TRAMADOL HCL 50 MG PO TABS: 50.0000 mg | ORAL_TABLET | Freq: Four times a day (QID) | ORAL | 0 refills | Status: DC | PRN

## 2022-10-03 NOTE — Telephone Encounter (Signed)
Please advise. Patient would like for refill to go to [facility] [address] [place].

## 2022-10-05 ENCOUNTER — Encounter: Payer: Self-pay | Admitting: Orthopaedic Surgery

## 2022-10-10 ENCOUNTER — Encounter: Payer: Self-pay | Admitting: Orthopaedic Surgery

## 2022-10-10 ENCOUNTER — Other Ambulatory Visit: Payer: Self-pay | Admitting: Orthopaedic Surgery

## 2022-10-10 MED ORDER — TRAMADOL HCL 50 MG PO TABS: 50.0000 mg | ORAL_TABLET | Freq: Two times a day (BID) | ORAL | 0 refills | Status: DC | PRN

## 2022-10-14 ENCOUNTER — Other Ambulatory Visit: Payer: Self-pay | Admitting: Orthopaedic Surgery

## 2022-10-14 MED ORDER — TRAMADOL HCL 50 MG PO TABS: 50.0000 mg | ORAL_TABLET | Freq: Two times a day (BID) | ORAL | 0 refills | Status: DC | PRN

## 2022-10-15 ENCOUNTER — Encounter: Payer: Self-pay | Admitting: Orthopaedic Surgery

## 2022-10-17 ENCOUNTER — Telehealth: Payer: Self-pay | Admitting: *Deleted

## 2022-10-17 NOTE — Telephone Encounter (Signed)
Name: Valerie [name]  DOB: 1980/08/24  MRN: [id number]  Primary Cardiologist: None  Chart reviewed as part of pre-operative protocol coverage. Because of [name]'s past medical history and time since last visit, she will require a follow-up in-office visit in order to better assess preoperative cardiovascular risk.  Pre-op covering staff: - Please schedule appointment and call patient to inform them. If patient already had an upcoming appointment within acceptable timeframe, please add "pre-op clearance" to the appointment notes so provider is aware. - Please contact requesting surgeon's office via preferred method (i.e, phone, fax) to inform them of need for appointment prior to surgery.  Carlos Levering, NP  10/17/2022, 5:13 PM

## 2022-10-17 NOTE — Telephone Encounter (Signed)
Pre-operative Risk Assessment    Patient Name: Valerie [name]  DOB: 1980/09/29 MRN: [id number]      Request for Surgical Clearance    Procedure:   RIGHT CARPAL TUNNEL RELEASE  Date of Surgery:  Clearance 10/24/22                                 Surgeon:  DR. MARK YATES Surgeon's Group or Practice Name:  [facility] [place] Phone number:  [phone] Fax number:  [phone]   Type of Clearance Requested:   - Medical ; NO MEDICATIONS LISTED AS NEEDING TO BE HELD   Type of Anesthesia:  MAC   Additional requests/questions:    Valerie [name]   10/17/2022, 3:15 PM

## 2022-10-18 ENCOUNTER — Other Ambulatory Visit: Payer: Self-pay

## 2022-10-18 ENCOUNTER — Encounter (HOSPITAL_BASED_OUTPATIENT_CLINIC_OR_DEPARTMENT_OTHER): Payer: Self-pay | Admitting: Orthopaedic Surgery

## 2022-10-18 ENCOUNTER — Other Ambulatory Visit: Payer: Self-pay | Admitting: Physician Assistant

## 2022-10-18 NOTE — Telephone Encounter (Signed)
Pt has been scheduled to see Dr. Wyline Mood 10/19/22 for pre op clearance. I will update all parties involved.

## 2022-10-18 NOTE — Telephone Encounter (Signed)
Pt has procedure 10/24/22 with Dr. Annell Greening. Per pre op APP pt will need in office appt. In notes in the chart Dr. Wyline Mood is listed as pt's primary cardiologist. Pt saw Dr. Wyline Mood 01/22/2018 and was then advised to f/u with him in 6 mnths, this f/u did not take place. The pt then had a video visit with Dr. [name] 11/09/2018. Pt then saw Rennis Harding 09/25/20 and was advised at that appt to f/u with Dr. Wyline Mood or APP 6 month, though this appt did not happen as well.

## 2022-10-18 NOTE — Telephone Encounter (Signed)
Left pt a message to set up apt for surgical clearance.

## 2022-10-19 ENCOUNTER — Encounter: Payer: Self-pay | Admitting: Cardiology

## 2022-10-19 ENCOUNTER — Ambulatory Visit: Payer: Medicaid Other | Attending: Cardiology | Admitting: Cardiology

## 2022-10-19 VITALS — BP 102/60 | HR 71 | Ht 66.0 in | Wt 199.6 lb

## 2022-10-19 DIAGNOSIS — I4719 Other supraventricular tachycardia: Secondary | ICD-10-CM | POA: Diagnosis not present

## 2022-10-19 DIAGNOSIS — Z01818 Encounter for other preprocedural examination: Secondary | ICD-10-CM

## 2022-10-19 DIAGNOSIS — E782 Mixed hyperlipidemia: Secondary | ICD-10-CM | POA: Diagnosis not present

## 2022-10-19 NOTE — Telephone Encounter (Signed)
Ok to proceed with carpal tunnel surgery  Dominga Ferry MD

## 2022-10-19 NOTE — Patient Instructions (Signed)
Medication Instructions:  Your physician recommends that you continue on your current medications as directed. Please refer to the Current Medication list given to you today.  *If you need a refill on your cardiac medications before your next appointment, please call your pharmacy*   Lab Work: None If you have labs (blood work) drawn today and your tests are completely normal, you will receive your results only by: MyChart Message (if you have MyChart) OR A paper copy in the mail If you have any lab test that is abnormal or we need to change your treatment, we will call you to review the results.   Testing/Procedures: None   Follow-Up: At [facility], you and your health needs are our priority.  As part of our continuing mission to provide you with exceptional heart care, we have created designated Provider Care Teams.  These Care Teams include your primary Cardiologist (physician) and Advanced Practice Providers (APPs -  Physician Assistants and Nurse Practitioners) who all work together to provide you with the care you need, when you need it.  We recommend signing up for the patient portal called "MyChart".  Sign up information is provided on this After Visit Summary.  MyChart is used to connect with patients for Virtual Visits (Telemedicine).  Patients are able to view lab/test results, encounter notes, upcoming appointments, etc.  Non-urgent messages can be sent to your provider as well.   To learn more about what you can do with MyChart, go to [email or web address].    Your next appointment:    Follow up as needed.   Provider:   Jonathan Branch, MD    Other Instructions    

## 2022-10-19 NOTE — Progress Notes (Signed)
Clinical Summary Valerie [name] is a 42 y.o.female seen today for follow up of the following medical problems.   1. AVNRT - s/p ablation  11/2018 with Dr [name] - no recent palpitations - compliant with meds    2. DM2 - followed pcp   3. Hyperlipidemia - 09/2022 TC 195 TG 163 HDL 43 LDL 123 - just started late Virgilene  4. Preop - planning for carpal tunnel surgery - tolerates greater 4 METs without troubles.  - uses push mower x 30 min without.   Past Medical History:  Diagnosis Date   Anxiety    Asthma    Bacterial vaginosis    Bronchitis    Diabetes (HCC)    High cholesterol    Overweight    SVT (supraventricular tachycardia)      No Known Allergies   Current Outpatient Medications  Medication Sig Dispense Refill   albuterol (PROVENTIL HFA;VENTOLIN HFA) 108 (90 BASE) MCG/ACT inhaler Inhale 1-2 puffs into the lungs every 6 (six) hours as needed for wheezing or shortness of breath. 1 Inhaler 0   ALPRAZolam (XANAX) 1 MG tablet Take 1 mg by mouth 3 (three) times daily as needed for anxiety.     amoxicillin-clavulanate (AUGMENTIN) 875-125 MG tablet Take 1 tablet by mouth 2 (two) times daily.     atorvastatin (LIPITOR) 10 MG tablet Take 10 mg by mouth daily.     JARDIANCE 10 MG TABS tablet Take 10 mg by mouth daily. (Patient not taking: Reported on 10/18/2022)     metFORMIN (GLUCOPHAGE) 1000 MG tablet Take 500 mg by mouth 4 (four) times daily.     metoprolol succinate (TOPROL-XL) 25 MG 24 hr tablet Take 2 tablets (50 mg total) by mouth every morning. (Patient taking differently: Take 50 mg by mouth daily.)     ondansetron (ZOFRAN-ODT) 4 MG disintegrating tablet Take 1 tablet (4 mg total) by mouth every 8 (eight) hours as needed for nausea. 10 tablet 0   traMADol (ULTRAM) 50 MG tablet Take 1 tablet (50 mg total) by mouth every 12 (twelve) hours as needed. 15 tablet 0   No current facility-administered medications for this visit.     Past Surgical History:   Procedure Laterality Date   CESAREAN SECTION     INDUCED ABORTION     SVT ABLATION N/A 12/06/2018   Procedure: SVT ABLATION;  Surgeon: Hillis Range, MD;  Location: [facility];  Service: Cardiovascular;  Laterality: N/A;   TONSILLECTOMY     TUBAL LIGATION       No Known Allergies    Family History  Problem Relation Age of Onset   Diabetes Mother    Hypertension Mother    Diabetes Other    Hypertension Other      Social History Valerie [name] reports that she has been smoking cigarettes. She has been smoking an average of .5 packs per day. She has never used smokeless tobacco. Valerie [name] reports current alcohol use.   Review of Systems CONSTITUTIONAL: No weight loss, fever, chills, weakness or fatigue.  HEENT: Eyes: No visual loss, blurred vision, double vision or yellow sclerae.No hearing loss, sneezing, congestion, runny nose or sore throat.  SKIN: No rash or itching.  CARDIOVASCULAR: per hpi RESPIRATORY: No shortness of breath, cough or sputum.  GASTROINTESTINAL: No anorexia, nausea, vomiting or diarrhea. No abdominal pain or blood.  GENITOURINARY: No burning on urination, no polyuria NEUROLOGICAL: No headache, dizziness, syncope, paralysis, ataxia, numbness or tingling in the extremities.  No change in bowel or bladder control.  MUSCULOSKELETAL: No muscle, back pain, joint pain or stiffness.  LYMPHATICS: No enlarged nodes. No history of splenectomy.  PSYCHIATRIC: No history of depression or anxiety.  ENDOCRINOLOGIC: No reports of sweating, cold or heat intolerance. No polyuria or polydipsia.  Marland Kitchen   Physical Examination Today's Vitals   10/19/22 1349  BP: 102/60  Pulse: 71  SpO2: 95%  Weight: 199 lb 9.6 oz (90.5 kg)  Height: 5\' 6"  (1.676 m)   Body mass index is 32.22 kg/m.  Gen: resting comfortably, no acute distress HEENT: no scleral icterus, pupils equal round and reactive, no palptable cervical adenopathy,  CV: RRR, no m/rg, no jvd Resp:  Clear to auscultation bilaterally GI: abdomen is soft, non-tender, non-distended, normal bowel sounds, no hepatosplenomegaly MSK: extremities are warm, no edema.  Skin: warm, no rash Neuro:  no focal deficits Psych: appropriate affect   Diagnostic Studies 03/2018 monitor  30 day event monitor. Data available from 70% of planned monitored time Min HR 61, Max HR 169, Avg HR 91 Telemetry tracings show sinus rhythm and episodes of SVT up to 169 bpm.  Assessment and Plan  1. AVNRT - doing well s/p ablation nearly 4 years ago, remains on metoprolol - no symptoms - continue to monitor - EKG today showed NSR  2. Hyperlipidemia - just started statin, would set goal LDL of <100  3. Preoperative evaluation - plans for carpal tunnel surgery - no active acute cardiac conditions - low risk surgery. Tolerates greater than 4 METs - recommend proceeding with surgery from cardiac standpoint   F/u just as needed      Antoine Poche, M.D.

## 2022-10-20 ENCOUNTER — Encounter (HOSPITAL_COMMUNITY)
Admission: RE | Admit: 2022-10-20 | Discharge: 2022-10-20 | Disposition: A | Discharge status: Home / Self Care | Payer: Medicaid Other | Source: Ambulatory Visit | Attending: Orthopaedic Surgery | Admitting: Orthopaedic Surgery

## 2022-10-20 DIAGNOSIS — Z01812 Encounter for preprocedural laboratory examination: Secondary | ICD-10-CM | POA: Insufficient documentation

## 2022-10-20 DIAGNOSIS — E119 Type 2 diabetes mellitus without complications: Secondary | ICD-10-CM | POA: Diagnosis not present

## 2022-10-20 LAB — BASIC METABOLIC PANEL
Anion gap: 8 (ref 5–15)
BUN: 11 mg/dL (ref 6–20)
CO2: 24 mmol/L (ref 22–32)
Calcium: 8.7 mg/dL — ABNORMAL LOW (ref 8.9–10.3)
Chloride: 104 mmol/L (ref 98–111)
Creatinine, Ser: 0.67 mg/dL (ref 0.44–1.00)
GFR, Estimated: >60 mL/min (ref >60)
Glucose, Bld: 94 mg/dL (ref 70–99)
Potassium: 3.9 mmol/L (ref 3.5–5.1)
Sodium: 136 mmol/L (ref 135–145)

## 2022-10-22 ENCOUNTER — Encounter: Payer: Self-pay | Admitting: Orthopaedic Surgery

## 2022-10-24 ENCOUNTER — Encounter: Payer: Self-pay | Admitting: Orthopaedic Surgery

## 2022-10-24 ENCOUNTER — Other Ambulatory Visit: Payer: Self-pay

## 2022-10-24 ENCOUNTER — Ambulatory Visit (HOSPITAL_BASED_OUTPATIENT_CLINIC_OR_DEPARTMENT_OTHER): Payer: Medicaid Other | Admitting: Certified Registered"

## 2022-10-24 ENCOUNTER — Encounter (HOSPITAL_BASED_OUTPATIENT_CLINIC_OR_DEPARTMENT_OTHER): Payer: Self-pay | Admitting: Orthopaedic Surgery

## 2022-10-24 ENCOUNTER — Ambulatory Visit (HOSPITAL_BASED_OUTPATIENT_CLINIC_OR_DEPARTMENT_OTHER)
Admission: RE | Admit: 2022-10-24 | Discharge: 2022-10-24 | Disposition: A | Discharge status: Home / Self Care | Payer: Medicaid Other | Source: Ambulatory Visit | Attending: Orthopaedic Surgery | Admitting: Orthopaedic Surgery

## 2022-10-24 ENCOUNTER — Encounter (HOSPITAL_BASED_OUTPATIENT_CLINIC_OR_DEPARTMENT_OTHER)
Admission: RE | Disposition: A | Discharge status: Home / Self Care | Payer: Self-pay | Source: Ambulatory Visit | Attending: Orthopaedic Surgery

## 2022-10-24 DIAGNOSIS — F17210 Nicotine dependence, cigarettes, uncomplicated: Secondary | ICD-10-CM | POA: Diagnosis not present

## 2022-10-24 DIAGNOSIS — Z79899 Other long term (current) drug therapy: Secondary | ICD-10-CM | POA: Diagnosis not present

## 2022-10-24 DIAGNOSIS — J45909 Unspecified asthma, uncomplicated: Secondary | ICD-10-CM | POA: Insufficient documentation

## 2022-10-24 DIAGNOSIS — G5601 Carpal tunnel syndrome, right upper limb: Secondary | ICD-10-CM | POA: Diagnosis not present

## 2022-10-24 DIAGNOSIS — I10 Essential (primary) hypertension: Secondary | ICD-10-CM | POA: Insufficient documentation

## 2022-10-24 DIAGNOSIS — G5603 Carpal tunnel syndrome, bilateral upper limbs: Secondary | ICD-10-CM | POA: Insufficient documentation

## 2022-10-24 DIAGNOSIS — Z7984 Long term (current) use of oral hypoglycemic drugs: Secondary | ICD-10-CM | POA: Diagnosis not present

## 2022-10-24 DIAGNOSIS — E119 Type 2 diabetes mellitus without complications: Secondary | ICD-10-CM | POA: Diagnosis not present

## 2022-10-24 HISTORY — PX: CARPAL TUNNEL RELEASE: SHX101

## 2022-10-24 HISTORY — DX: Essential (primary) hypertension: I10

## 2022-10-24 LAB — GLUCOSE, CAPILLARY
Glucose-Capillary: 90 mg/dL (ref 70–99)
Glucose-Capillary: 95 mg/dL (ref 70–99)

## 2022-10-24 SURGERY — CARPAL TUNNEL RELEASE
Anesthesia: Monitor Anesthesia Care | Site: Wrist | Laterality: Right

## 2022-10-24 MED ORDER — PROPOFOL 500 MG/50ML IV EMUL
INTRAVENOUS | Status: DC | PRN
  Administered 2022-10-24: 100 ug/kg/min via INTRAVENOUS

## 2022-10-24 MED ORDER — LIDOCAINE HCL (CARDIAC) PF 100 MG/5ML IV SOSY
PREFILLED_SYRINGE | INTRAVENOUS | Status: DC | PRN
  Administered 2022-10-24: 30 mg via INTRAVENOUS

## 2022-10-24 MED ORDER — CEFAZOLIN SODIUM-DEXTROSE 2-4 GM/100ML-% IV SOLN
INTRAVENOUS | Status: AC
  Filled 2022-10-24: qty 100

## 2022-10-24 MED ORDER — PROPOFOL 10 MG/ML IV BOLUS
INTRAVENOUS | Status: DC | PRN
  Administered 2022-10-24: 20 mg via INTRAVENOUS

## 2022-10-24 MED ORDER — HYDROCODONE-ACETAMINOPHEN 5-325 MG PO TABS: 1.0000 | ORAL_TABLET | Freq: Four times a day (QID) | ORAL | 0 refills | Status: DC | PRN

## 2022-10-24 MED ORDER — FENTANYL CITRATE (PF) 100 MCG/2ML IJ SOLN: 25.0000 ug | INTRAMUSCULAR | Status: DC | PRN

## 2022-10-24 MED ORDER — LACTATED RINGERS IV SOLN: INTRAVENOUS | Status: DC

## 2022-10-24 MED ORDER — MIDAZOLAM HCL 5 MG/5ML IJ SOLN
INTRAMUSCULAR | Status: DC | PRN
  Administered 2022-10-24: 2 mg via INTRAVENOUS

## 2022-10-24 MED ORDER — FENTANYL CITRATE (PF) 100 MCG/2ML IJ SOLN
INTRAMUSCULAR | Status: DC | PRN
  Administered 2022-10-24: 50 ug via INTRAVENOUS

## 2022-10-24 MED ORDER — ACETAMINOPHEN 500 MG PO TABS
1000.0000 mg | ORAL_TABLET | Freq: Once | ORAL | Status: AC
  Administered 2022-10-24: 1000 mg via ORAL

## 2022-10-24 MED ORDER — BUPIVACAINE HCL (PF) 0.25 % IJ SOLN
INTRAMUSCULAR | Status: DC | PRN
  Administered 2022-10-24: 4 mL

## 2022-10-24 MED ORDER — LIDOCAINE HCL (PF) 1 % IJ SOLN
INTRAMUSCULAR | Status: AC
  Filled 2022-10-24: qty 30

## 2022-10-24 MED ORDER — LIDOCAINE HCL (PF) 1 % IJ SOLN
INTRAMUSCULAR | Status: DC | PRN
  Administered 2022-10-24: 4 mL

## 2022-10-24 MED ORDER — CEFAZOLIN SODIUM-DEXTROSE 2-4 GM/100ML-% IV SOLN
2.0000 g | INTRAVENOUS | Status: AC
  Administered 2022-10-24: 2 g via INTRAVENOUS

## 2022-10-24 MED ORDER — ACETAMINOPHEN 500 MG PO TABS
ORAL_TABLET | ORAL | Status: AC
  Filled 2022-10-24: qty 2

## 2022-10-24 MED ORDER — FENTANYL CITRATE (PF) 100 MCG/2ML IJ SOLN
INTRAMUSCULAR | Status: AC
  Filled 2022-10-24: qty 2

## 2022-10-24 MED ORDER — ONDANSETRON HCL 4 MG/2ML IJ SOLN
INTRAMUSCULAR | Status: DC | PRN
  Administered 2022-10-24: 4 mg via INTRAVENOUS

## 2022-10-24 MED ORDER — MIDAZOLAM HCL 2 MG/2ML IJ SOLN
INTRAMUSCULAR | Status: AC
  Filled 2022-10-24: qty 2

## 2022-10-24 SURGICAL SUPPLY — 41 items
APL SKNCLS STERI-STRIP NONHPOA (GAUZE/BANDAGES/DRESSINGS)
BENZOIN TINCTURE PRP APPL 2/3 (GAUZE/BANDAGES/DRESSINGS) IMPLANT
BLADE SURG 15 STRL LF DISP TIS (BLADE) ×2 IMPLANT
BLADE SURG 15 STRL SS (BLADE) ×1
BNDG CMPR 5X4 KNIT ELC UNQ LF (GAUZE/BANDAGES/DRESSINGS) ×1
BNDG CMPR 9X4 STRL LF SNTH (GAUZE/BANDAGES/DRESSINGS) ×1
BNDG ELASTIC 4INX 5YD STR LF (GAUZE/BANDAGES/DRESSINGS) ×2 IMPLANT
BNDG ESMARK 4X9 LF (GAUZE/BANDAGES/DRESSINGS) ×2 IMPLANT
CORD BIPOLAR FORCEPS 12FT (ELECTRODE) ×2 IMPLANT
COVER BACK TABLE 60X90IN (DRAPES) ×2 IMPLANT
COVER MAYO STAND STRL (DRAPES) ×2 IMPLANT
CUFF TOURN SGL QUICK 18X4 (TOURNIQUET CUFF) ×2 IMPLANT
DRAPE EXTREMITY T 121X128X90 (DISPOSABLE) ×2 IMPLANT
DRAPE SURG 17X23 STRL (DRAPES) ×2 IMPLANT
DURAPREP 26ML APPLICATOR (WOUND CARE) ×2 IMPLANT
GAUZE SPONGE 4X4 12PLY STRL (GAUZE/BANDAGES/DRESSINGS) ×2 IMPLANT
GAUZE XEROFORM 1X8 LF (GAUZE/BANDAGES/DRESSINGS) ×2 IMPLANT
GLOVE BIO SURGEON STRL SZ7.5 (GLOVE) ×2 IMPLANT
GLOVE BIOGEL PI IND STRL 8 (GLOVE) ×2 IMPLANT
GOWN STRL REUS W/ TWL LRG LVL3 (GOWN DISPOSABLE) ×2 IMPLANT
GOWN STRL REUS W/ TWL XL LVL3 (GOWN DISPOSABLE) ×2 IMPLANT
GOWN STRL REUS W/TWL LRG LVL3 (GOWN DISPOSABLE) ×1
GOWN STRL REUS W/TWL XL LVL3 (GOWN DISPOSABLE) ×1
LOOP VASCLR MAXI BLUE 18IN ST (MISCELLANEOUS) IMPLANT
LOOP VASCULAR MAXI 18 BLUE (MISCELLANEOUS)
LOOPS VASCLR MAXI BLUE 18IN ST (MISCELLANEOUS) IMPLANT
NDL HYPO 25X1 1.5 SAFETY (NEEDLE) ×2 IMPLANT
NEEDLE HYPO 25X1 1.5 SAFETY (NEEDLE) ×1 IMPLANT
NS IRRIG 1000ML POUR BTL (IV SOLUTION) ×2 IMPLANT
PACK BASIN DAY SURGERY FS (CUSTOM PROCEDURE TRAY) ×2 IMPLANT
PAD CAST 3X4 CTTN HI CHSV (CAST SUPPLIES) ×2 IMPLANT
PADDING CAST ABS COTTON 4X4 ST (CAST SUPPLIES) ×2 IMPLANT
PADDING CAST COTTON 3X4 STRL (CAST SUPPLIES) ×1
STOCKINETTE 4X48 STRL (DRAPES) ×2 IMPLANT
STRIP CLOSURE SKIN 1/2X4 (GAUZE/BANDAGES/DRESSINGS) IMPLANT
SUT ETHILON 3 0 PS 1 (SUTURE) ×2 IMPLANT
SYR BULB EAR ULCER 3OZ GRN STR (SYRINGE) ×2 IMPLANT
SYR CONTROL 10ML LL (SYRINGE) IMPLANT
TOWEL GREEN STERILE FF (TOWEL DISPOSABLE) ×2 IMPLANT
UNDERPAD 30X36 HEAVY ABSORB (UNDERPADS AND DIAPERS) ×2 IMPLANT
VASCULAR TIE MAXI BLUE 18IN ST (MISCELLANEOUS)

## 2022-10-24 NOTE — H&P (Signed)
Requested by: [name], PA-C [address] [place],  [place] [address] PCP: [name], PA-C     Assessment & Plan: Visit Diagnoses:  1. Bilateral hand numbness       Plan: Patient with severe bilateral carpal tunnel syndrome, we will do the right wrist first which is her request.  She understands she will be out several weeks after surgery and she can decide after the right wrist is done when she like to proceed with the left.   Follow-Up Instructions: No follow-ups on file.    Orders:  No orders of the defined types were placed in this encounter.   No orders of the defined types were placed in this encounter.        Procedures: No procedures performed     Clinical Data: No additional findings.     Subjective:    Chief Complaint  Patient presents with   Neck - Pain   Right Hand - Pain   Left Hand - Pain      HPI 42 year old female returns with bilateral carpal tunnel syndrome and electrical test have just been completed which shows severe bilateral median nerve entrapment and the rest.  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  She has had to take some tramadol to get some relief and she like to have her right hand surgically treated first.   Review of Systems patient does have history of asthma not currently bothering her.  Type 2 diabetes With blood glucose levels over the last 10 years varying between 105 to only 1 reading over 200.  Negative for stroke or MI.     Objective: Vital Signs: Ht 5\' 7"  (1.702 m)   Wt 196 lb (88.9 kg)   LMP 03/09/2018   BMI 30.70 kg/m    Physical Exam Constitutional:      Appearance: She is well-developed.  HENT:     Head: Normocephalic.     Right Ear: External ear normal.     Left Ear: External ear normal. There is no impacted cerumen.  Eyes:     Pupils: Pupils are equal, round, and reactive to light.  Neck:     Thyroid: No thyromegaly.     Trachea: No tracheal deviation.   Cardiovascular:     Rate and Rhythm: Normal rate.  Pulmonary:     Effort: Pulmonary effort is normal.  Abdominal:     Palpations: Abdomen is soft.  Musculoskeletal:     Cervical back: No rigidity.  Skin:    General: Skin is warm and dry.  Neurological:     Mental Status: She is alert and oriented to person, place, and time.  Psychiatric:        Behavior: Behavior normal.        Ortho Exam good cervical range of motion.  Positive Phalen's positive carpal compression test positive Tinel's both right and left wrist.  Ulnar nerve at the elbow is normal.  Normal heel-toe gait.   Specialty Comments:  atient Information   Name MRN Description  Valerie [name] [id number] 42 y.o. female    Interpretation Summary   EMG & NCV Findings: Evaluation of the left median motor and the right median motor nerves showed prolonged distal onset latency (L7.7, R6.8 ms) and decreased conduction velocity (Elbow-Wrist, L45, R47 m/s).  The left median (across palm) sensory nerve showed prolonged distal peak latency (Wrist, 6.7 ms) and reduced amplitude (3.3 V).  The right median (across palm)  sensory nerve showed no response (Wrist) and prolonged distal peak latency (Palm, 3.6 ms).  The right ulnar sensory nerve showed prolonged distal peak latency (4.4 ms) and decreased conduction velocity (Wrist-5th Digit, 32 m/s).  All remaining nerves (as indicated in the following tables) were within normal limits.  Left vs. Right side comparison data for the median motor nerve indicates abnormal L-R latency difference (0.9 ms).  The ulnar sensory nerve indicates abnormal L-R latency difference (1.2 ms).  All remaining left vs. right side differences were within normal limits.     All examined muscles (as indicated in the following table) showed no evidence of electrical instability.     Impression: The above electrodiagnostic study is ABNORMAL and reveals evidence of a severe bilateral median nerve entrapment at  the wrist (carpal tunnel syndrome) affecting sensory and motor components.    There is no significant electrodiagnostic evidence of any other focal nerve entrapment, brachial plexopathy or cervical radiculopathy.  As you know, this particular electrodiagnostic study cannot rule out chemical radiculitis or sensory only radiculopathy.   Recommendations: 1.  Follow-up with referring physician. 2.  Continue current management of symptoms. 3.  Suggest surgical evaluation.   ___________________________ Elease Hashimoto Board Certified, American Board of Physical Medicine and Rehabilitation       Imaging: No results found.     PMFS History:     Patient Active Problem List    Diagnosis Date Noted   Bilateral hand numbness 08/19/2022   Chondromalacia patellae, right knee 12/26/2019   Lipoma of left shoulder 10/17/2019   Anterior knee pain, right 08/26/2019   Asthma 06/15/2016   Type 2 diabetes mellitus 06/15/2016   Other abnormal glucose 06/28/2015   Hidradenitis 06/28/2015        Past Medical History:  Diagnosis Date   Anxiety     Asthma     Bacterial vaginosis     Bronchitis     Diabetes     High cholesterol     Overweight     SVT (supraventricular tachycardia)           Family History  Problem Relation Age of Onset   Diabetes Mother     Hypertension Mother     Diabetes Other     Hypertension Other           Past Surgical History:  Procedure Laterality Date   CESAREAN SECTION       INDUCED ABORTION       SVT ABLATION N/A 12/06/2018    Procedure: SVT ABLATION;  Surgeon: Hillis Range, MD;  Location: [facility];  Service: Cardiovascular;  Laterality: N/A;   TONSILLECTOMY       TUBAL LIGATION        Social History         Occupational History   Not on file  Tobacco Use   Smoking status: Every Day      Packs/day: .5      Types: Cigarettes   Smokeless tobacco: Never   Tobacco comments:      almost 1 pack daily  Vaping Use   Vaping Use: Never  used  Substance and Sexual Activity   Alcohol use: Yes      Comment: rare   Drug use: No   Sexual activity: Never      Birth control/protection: Surgical

## 2022-10-24 NOTE — Interval H&P Note (Signed)
History and Physical Interval Note:  10/24/2022 1:07 PM  Valerie [name]  has presented today for surgery, with the diagnosis of RIGHT CARPAL TUNNEL SYNDROME.  The various methods of treatment have been discussed with the patient and family. After consideration of risks, benefits and other options for treatment, the patient has consented to  Procedure(s): RIGHT CARPAL TUNNEL RELEASE (Right) as a surgical intervention.  The patient's history has been reviewed, patient examined, no change in status, stable for surgery.  I have reviewed the patient's chart and labs.  Questions were answered to the patient's satisfaction.     Eldred Manges

## 2022-10-24 NOTE — Anesthesia Preprocedure Evaluation (Addendum)
Anesthesia Evaluation  Patient identified by MRN, date of birth, ID band Patient awake    Reviewed: Allergy & Precautions, NPO status , Patient's Chart, lab work & pertinent test results, reviewed documented beta blocker date and time   Airway Mallampati: II  TM Distance: >3 FB Neck ROM: Full    Dental no notable dental hx. (+) Teeth Intact, Dental Advisory Given   Pulmonary asthma , Current SmokerPatient did not abstain from smoking.   Pulmonary exam normal breath sounds clear to auscultation       Cardiovascular hypertension, Pt. on home beta blockers and Pt. on medications Normal cardiovascular exam+ dysrhythmias Supra Ventricular Tachycardia  Rhythm:Regular Rate:Normal     Neuro/Psych  PSYCHIATRIC DISORDERS Anxiety     negative neurological ROS     GI/Hepatic negative GI ROS, Neg liver ROS,,,  Endo/Other  diabetes, Type 2, Oral Hypoglycemic Agents    Renal/GU negative Renal ROS  negative genitourinary   Musculoskeletal  (+) Arthritis ,    Abdominal   Peds  Hematology negative hematology ROS (+)   Anesthesia Other Findings   Reproductive/Obstetrics                             Anesthesia Physical Anesthesia Plan  ASA: 3  Anesthesia Plan: MAC   Post-op Pain Management: Tylenol PO (pre-op)*   Induction: Intravenous  PONV Risk Score and Plan: 1 and Propofol infusion, Treatment may vary due to age or medical condition, Midazolam and Ondansetron  Airway Management Planned: Natural Airway  Additional Equipment:   Intra-op Plan:   Post-operative Plan:   Informed Consent: I have reviewed the patients History and Physical, chart, labs and discussed the procedure including the risks, benefits and alternatives for the proposed anesthesia with the patient or authorized representative who has indicated his/her understanding and acceptance.     Dental advisory given  Plan Discussed  with: CRNA  Anesthesia Plan Comments:        Anesthesia Quick Evaluation

## 2022-10-24 NOTE — Op Note (Signed)
Pre and postop diagnosis: Right carpal tunnel syndrome  Procedure: Right carpal tunnel release  Surgeon: Annell Greening, MD  Anesthesia: MAC +4 cc Marcaine local.  Tourniquet time: 250 x 7 minutes.    Findings: Median nerve compression in the carpal canal with chronic tenosynovitis.  Procedure: After induction of IV sedation forearm tourniquet prepping with DuraPrep the usual extremity sheets and drapes stockinette timeout procedure purple skin marker was used based on cardinal Line for Identification of the End of the Carpal Canal Incision Was Made after Skin Marker Elevation and Wrapping the Arm with Esmarch after Timeout Procedure and Then Skin Incision.  Bipolar Cautery Was Used.  Palmar fascia was divided.  Transverse carpal ligament was thickened and was divided along the ulnar aspect of the median nerve there was chronic tenosynovitis present.  There was a distal radial thenar motor takeoff.  No masses are present but there was significant chronic tenosynovitis of the flexor tendons.  Carpal canal release was performed until the palmar fat and the transverse arch between the ulnar and radial nerve was directly visualized.  Fingertip was introduced proximally into the distal forearm and there was no areas compression.  Irrigation tourniquet deflation bipolar cautery hemostasis and then closure of skin with 3-0 interrupted nylon.  Postop dressing Xeroform tween wrist 4 x 4's Webril and Ace wrap was applied for postop dressing patient tolerated procedure well transferred recovery in stable condition.

## 2022-10-24 NOTE — Anesthesia Procedure Notes (Signed)
Procedure Name: MAC Date/Time: 10/24/2022 1:46 PM  Performed by: Sheryn Bison, CRNAPre-anesthesia Checklist: Patient identified, Emergency Drugs available, Suction available, Patient being monitored and Timeout performed Patient Re-evaluated:Patient Re-evaluated prior to induction Oxygen Delivery Method: Simple face mask

## 2022-10-24 NOTE — Telephone Encounter (Signed)
noted 

## 2022-10-24 NOTE — Discharge Instructions (Addendum)
Keep your hand elevated above your heart most the time will help with pain and swelling.  Pain medications been sent into your pharmacy.  Keep your dressing on to keep it dry you can put a bag over your hand and keep it elevated while you take a shower.  Return the office in 8 days for dressing change.  Pain medication has been sent into your pharmacy.  You could additionally take some ibuprofen or Aleve if you choose.   Post Anesthesia Home Care Instructions  Activity: Get plenty of rest for the remainder of the day. A responsible individual must stay with you for 24 hours following the procedure.  For the next 24 hours, DO NOT: -Drive a car -Advertising copywriter -Drink alcoholic beverages -Take any medication unless instructed by your physician -Make any legal decisions or sign important papers.  Meals: Start with liquid foods such as gelatin or soup. Progress to regular foods as tolerated. Avoid greasy, spicy, heavy foods. If nausea and/or vomiting occur, drink only clear liquids until the nausea and/or vomiting subsides. Call your physician if vomiting continues.  Special Instructions/Symptoms: Your throat may feel dry or sore from the anesthesia or the breathing tube placed in your throat during surgery. If this causes discomfort, gargle with warm salt water. The discomfort should disappear within 24 hours.  Tylenol can be taken at 7 pm if needed

## 2022-10-24 NOTE — Transfer of Care (Signed)
Immediate Anesthesia Transfer of Care Note  Patient: Valerie [name]  Procedure(s) Performed: RIGHT CARPAL TUNNEL RELEASE (Right: Wrist)  Patient Location: PACU  Anesthesia Type:MAC  Level of Consciousness: awake, alert , oriented, and patient cooperative  Airway & Oxygen Therapy: Patient Spontanous Breathing and Patient connected to face mask oxygen  Post-op Assessment: Report given to RN and Post -op Vital signs reviewed and stable  Post vital signs: Reviewed and stable  Last Vitals:  Vitals Value Taken Time  BP    Temp 36.3 C 10/24/22 1402  Pulse 68 10/24/22 1404  Resp 17 10/24/22 1404  SpO2 99 % 10/24/22 1404  Vitals shown include unvalidated device data.  Last Pain:  Vitals:   10/24/22 1226  TempSrc: Oral  PainSc: 8       Patients Stated Pain Goal: 3 (10/24/22 1226)  Complications: No notable events documented.

## 2022-10-24 NOTE — Interval H&P Note (Signed)
History and Physical Interval Note:  10/24/2022 1:18 PM  Valerie [name]  has presented today for surgery, with the diagnosis of RIGHT CARPAL TUNNEL SYNDROME.  The various methods of treatment have been discussed with the patient and family. After consideration of risks, benefits and other options for treatment, the patient has consented to  Procedure(s): RIGHT CARPAL TUNNEL RELEASE (Right) as a surgical intervention.  The patient's history has been reviewed, patient examined, no change in status, stable for surgery.  I have reviewed the patient's chart and labs.  Questions were answered to the patient's satisfaction.     Eldred Manges

## 2022-10-25 ENCOUNTER — Encounter (HOSPITAL_BASED_OUTPATIENT_CLINIC_OR_DEPARTMENT_OTHER): Payer: Self-pay | Admitting: Orthopaedic Surgery

## 2022-10-26 NOTE — Anesthesia Postprocedure Evaluation (Signed)
Anesthesia Post Note  Patient: Valerie [name]  Procedure(s) Performed: RIGHT CARPAL TUNNEL RELEASE (Right: Wrist)     Patient location during evaluation: PACU Anesthesia Type: MAC Level of consciousness: awake and alert Pain management: pain level controlled Vital Signs Assessment: post-procedure vital signs reviewed and stable Respiratory status: spontaneous breathing, nonlabored ventilation, respiratory function stable and patient connected to nasal cannula oxygen Cardiovascular status: stable and blood pressure returned to baseline Postop Assessment: no apparent nausea or vomiting Anesthetic complications: no  No notable events documented.  Last Vitals:  Vitals:   10/24/22 1415 10/24/22 1432  BP: 130/85 118/85  Pulse: 60 (!) 55  Resp: 14 16  Temp:  (!) 36.2 C  SpO2: 95% 99%    Last Pain:  Vitals:   10/25/22 0853  TempSrc:   PainSc: 1                  Omesha Bowerman L Previn Jian

## 2022-10-29 ENCOUNTER — Other Ambulatory Visit (INDEPENDENT_AMBULATORY_CARE_PROVIDER_SITE_OTHER): Payer: Self-pay | Admitting: Orthopaedic Surgery

## 2022-10-30 ENCOUNTER — Other Ambulatory Visit (INDEPENDENT_AMBULATORY_CARE_PROVIDER_SITE_OTHER): Payer: Self-pay | Admitting: Orthopaedic Surgery

## 2022-10-31 ENCOUNTER — Other Ambulatory Visit: Payer: Self-pay | Admitting: Orthopaedic Surgery

## 2022-10-31 ENCOUNTER — Telehealth: Payer: Self-pay | Admitting: Radiology

## 2022-10-31 NOTE — Telephone Encounter (Signed)
Please see message from [place] office below and advise.   Chumley, Carlon L  [place], [place], RT Pt called stating she has run out of pain medicine. She would like something to get her fthru to at least Wednesday when she comes back in for f/u appointment . She uses [facility] in [place] [address]  If needed, she can be reached at [phone]

## 2022-10-31 NOTE — Telephone Encounter (Signed)
noted 

## 2022-11-02 ENCOUNTER — Ambulatory Visit (INDEPENDENT_AMBULATORY_CARE_PROVIDER_SITE_OTHER): Payer: Medicaid Other | Admitting: Orthopaedic Surgery

## 2022-11-02 ENCOUNTER — Encounter: Payer: Self-pay | Admitting: Orthopaedic Surgery

## 2022-11-02 VITALS — Ht 66.0 in | Wt 199.0 lb

## 2022-11-02 DIAGNOSIS — G5603 Carpal tunnel syndrome, bilateral upper limbs: Secondary | ICD-10-CM

## 2022-11-02 DIAGNOSIS — G5601 Carpal tunnel syndrome, right upper limb: Secondary | ICD-10-CM

## 2022-11-02 NOTE — Progress Notes (Signed)
Post-Op Visit Note   Patient: Valerie [name]           Date of Birth: 01/14/1981           MRN: [id number] Visit Date: 11/02/2022 PCP: [name], PA-C   Assessment & Plan: 1 week post right carpal tunnel release.  She had severe bilateral carpal tunnel syndrome.  Left still bothers her.  She can shower then reapply the stockinette and wrist splint that she has.  Return 1 week and she will let us know on the timing of when she wants to proceed with the left carpal tunnel release.  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  Recheck 1 week for suture removal.  Chief Complaint:  Chief Complaint  Patient presents with   Right Hand - Routine Post Op    10/24/2022 Right CTR   Visit Diagnoses:  1. Carpal tunnel syndrome, right upper limb   2. Bilateral carpal tunnel syndrome     Plan: Return in 1 week for suture removal.  Follow-Up Instructions: Return in about 1 week (around 11/09/2022).   Orders:  No orders of the defined types were placed in this encounter.  No orders of the defined types were placed in this encounter.   Imaging: No results found.  PMFS History: Patient Active Problem List   Diagnosis Date Noted   Carpal tunnel syndrome, right upper limb 10/24/2022   Bilateral carpal tunnel syndrome 09/29/2022   Bilateral hand numbness 08/19/2022   Chondromalacia patellae, right knee 12/26/2019   Lipoma of left shoulder 10/17/2019   Anterior knee pain, right 08/26/2019   Asthma 06/15/2016   Type 2 diabetes mellitus (HCC) 06/15/2016   Other abnormal glucose 06/28/2015   Hidradenitis 06/28/2015   Past Medical History:  Diagnosis Date   Anxiety    Asthma    Bacterial vaginosis    Bronchitis    Diabetes (HCC)    High cholesterol    Hypertension    Overweight    SVT (supraventricular  tachycardia)     Family History  Problem Relation Age of Onset   Diabetes Mother    Hypertension Mother    Diabetes Other    Hypertension Other     Past Surgical History:  Procedure Laterality Date   CARPAL TUNNEL RELEASE Right 10/24/2022   Procedure: RIGHT CARPAL TUNNEL RELEASE;  Surgeon: Eldred Manges, MD;  Location: [facility];  Service: Orthopedics;  Laterality: Right;   CESAREAN SECTION     INDUCED ABORTION     SVT ABLATION N/A 12/06/2018   Procedure: SVT ABLATION;  Surgeon: Hillis Range, MD;  Location: [facility];  Service: Cardiovascular;  Laterality: N/A;   TONSILLECTOMY     TUBAL LIGATION     Social History   Occupational History   Not on file  Tobacco Use   Smoking status: Every Day    Packs/day: .5    Types: Cigarettes   Smokeless tobacco: Never   Tobacco comments:    almost 1 pack daily  Vaping Use   Vaping Use: Never used  Substance and Sexual Activity   Alcohol use: Yes    Comment: rare   Drug use: No   Sexual activity: Never    Birth control/protection: Surgical

## 2022-11-10 ENCOUNTER — Encounter: Payer: Self-pay | Admitting: Orthopaedic Surgery

## 2022-11-10 ENCOUNTER — Ambulatory Visit (INDEPENDENT_AMBULATORY_CARE_PROVIDER_SITE_OTHER): Payer: Medicaid Other | Admitting: Orthopaedic Surgery

## 2022-11-10 VITALS — Ht 66.0 in | Wt 199.0 lb

## 2022-11-10 DIAGNOSIS — G5603 Carpal tunnel syndrome, bilateral upper limbs: Secondary | ICD-10-CM

## 2022-11-10 MED ORDER — TRAMADOL HCL 50 MG PO TABS: 50.0000 mg | ORAL_TABLET | Freq: Two times a day (BID) | ORAL | 0 refills | Status: DC | PRN

## 2022-11-10 NOTE — Progress Notes (Signed)
Post-Op Visit Note   Patient: Valerie [name]           Date of Birth: 1981-06-10           MRN: [id number] Visit Date: 11/10/2022 PCP: [name], PA-C   Chief Complaint: Postop right carpal tunnel release.  Sutures removed Steri-Strips applied.  She would like to have her opposite hand done in couple weeks since she remains out of work.  Her job was at [employer] in [employer].  She has noticed improvement in the right hand.  1 proximal suture came out.   Chief Complaint  Patient presents with   Right Hand - Routine Post Op, Follow-up     10/24/2022 Right CTR     Visit Diagnoses:  1. Bilateral carpal tunnel syndrome     Plan: Will schedule her for carpal tunnel release opposite left hand in few weeks.  She like to go ahead and get them both done since her symptoms were severe and her electrical study showed severe median nerve compression both right and left.  She is happy with results of the right hand.  Follow-Up Instructions: No follow-ups on file.   Orders:  No orders of the defined types were placed in this encounter.  No orders of the defined types were placed in this encounter.   Imaging: No results found.  PMFS History: Patient Active Problem List   Diagnosis Date Noted   Carpal tunnel syndrome, right upper limb 10/24/2022   Bilateral carpal tunnel syndrome 09/29/2022   Bilateral hand numbness 08/19/2022   Chondromalacia patellae, right knee 12/26/2019   Lipoma of left shoulder 10/17/2019   Anterior knee pain, right 08/26/2019   Asthma 06/15/2016   Type 2 diabetes mellitus (HCC) 06/15/2016   Other abnormal glucose 06/28/2015   Hidradenitis 06/28/2015   Past Medical History:  Diagnosis Date   Anxiety    Asthma    Bacterial vaginosis    Bronchitis    Diabetes (HCC)    High cholesterol    Hypertension    Overweight    SVT (supraventricular tachycardia)     Family History  Problem Relation Age of Onset   Diabetes Mother    Hypertension  Mother    Diabetes Other    Hypertension Other     Past Surgical History:  Procedure Laterality Date   CARPAL TUNNEL RELEASE Right 10/24/2022   Procedure: RIGHT CARPAL TUNNEL RELEASE;  Surgeon: Eldred Manges, MD;  Location: [facility];  Service: Orthopedics;  Laterality: Right;   CESAREAN SECTION     INDUCED ABORTION     SVT ABLATION N/A 12/06/2018   Procedure: SVT ABLATION;  Surgeon: Hillis Range, MD;  Location: [facility];  Service: Cardiovascular;  Laterality: N/A;   TONSILLECTOMY     TUBAL LIGATION     Social History   Occupational History   Not on file  Tobacco Use   Smoking status: Every Day    Packs/day: .5    Types: Cigarettes   Smokeless tobacco: Never   Tobacco comments:    almost 1 pack daily  Vaping Use   Vaping Use: Never used  Substance and Sexual Activity   Alcohol use: Yes    Comment: rare   Drug use: No   Sexual activity: Never    Birth control/protection: Surgical

## 2022-11-10 NOTE — Addendum Note (Signed)
Addended by: Annell Greening C on: 11/10/2022 10:20 AM   Modules accepted: Orders

## 2022-11-14 ENCOUNTER — Encounter: Payer: Self-pay | Admitting: Orthopaedic Surgery

## 2022-11-15 ENCOUNTER — Encounter: Payer: Self-pay | Admitting: Orthopaedic Surgery

## 2022-11-22 ENCOUNTER — Encounter: Payer: Self-pay | Admitting: Orthopaedic Surgery

## 2022-11-22 ENCOUNTER — Other Ambulatory Visit: Payer: Self-pay | Admitting: Orthopaedic Surgery

## 2022-11-22 MED ORDER — TRAMADOL HCL 50 MG PO TABS: 50.0000 mg | ORAL_TABLET | Freq: Two times a day (BID) | ORAL | 0 refills | Status: DC | PRN

## 2022-11-22 NOTE — Telephone Encounter (Signed)
I called patient and scheduled surgery for 12/05/22.

## 2022-11-28 ENCOUNTER — Other Ambulatory Visit: Payer: Self-pay

## 2022-11-28 ENCOUNTER — Encounter (HOSPITAL_BASED_OUTPATIENT_CLINIC_OR_DEPARTMENT_OTHER): Payer: Self-pay | Admitting: Orthopaedic Surgery

## 2022-11-28 NOTE — Progress Notes (Signed)
   11/28/22 1030  PAT Phone Screen  Is the patient taking a GLP-1 receptor agonist? No  Do You Have Diabetes? Yes  Do You Have Hypertension? Yes  Have You Ever Been to the ER for Asthma? No  Have You Taken Oral Steroids in the Past 3 Months? No  Do you Take Phenteramine or any Other Diet Drugs? No  Recent  Lab Work, EKG, CXR? Yes  Where was this test performed? 10/19/22 EKG  Do you have a history of heart problems? Yes  Cardiologist Name Dr Wyline Mood OV 10/19/22 cleared for surgery  Have you ever had tests on your heart? Yes  What cardiac tests were performed? Other (comment)  What date/year were cardiac tests completed? 12/06/2018 Ablation for SVT  Results viewable: [facility] Media Tab  Any Recent Hospitalizations? No  Height 5\' 6"  (1.676 m)  Weight 90.3 kg  Pat Appointment Scheduled Yes (BMET faxed to [facility])

## 2022-11-30 ENCOUNTER — Other Ambulatory Visit: Payer: Self-pay | Admitting: Physician Assistant

## 2022-11-30 DIAGNOSIS — G5602 Carpal tunnel syndrome, left upper limb: Secondary | ICD-10-CM

## 2022-12-01 ENCOUNTER — Encounter (HOSPITAL_COMMUNITY)
Admission: RE | Admit: 2022-12-01 | Discharge: 2022-12-01 | Disposition: A | Discharge status: Home / Self Care | Payer: Medicaid Other | Source: Ambulatory Visit | Attending: Orthopaedic Surgery | Admitting: Orthopaedic Surgery

## 2022-12-01 DIAGNOSIS — E119 Type 2 diabetes mellitus without complications: Secondary | ICD-10-CM | POA: Diagnosis not present

## 2022-12-01 DIAGNOSIS — Z01812 Encounter for preprocedural laboratory examination: Secondary | ICD-10-CM | POA: Insufficient documentation

## 2022-12-01 LAB — BASIC METABOLIC PANEL
Anion gap: 10 (ref 5–15)
BUN: 11 mg/dL (ref 6–20)
CO2: 24 mmol/L (ref 22–32)
Calcium: 8.6 mg/dL — ABNORMAL LOW (ref 8.9–10.3)
Chloride: 101 mmol/L (ref 98–111)
Creatinine, Ser: 0.66 mg/dL (ref 0.44–1.00)
GFR, Estimated: >60 mL/min (ref >60)
Glucose, Bld: 141 mg/dL — ABNORMAL HIGH (ref 70–99)
Potassium: 4.1 mmol/L (ref 3.5–5.1)
Sodium: 135 mmol/L (ref 135–145)

## 2022-12-05 ENCOUNTER — Other Ambulatory Visit: Payer: Self-pay

## 2022-12-05 ENCOUNTER — Ambulatory Visit (HOSPITAL_BASED_OUTPATIENT_CLINIC_OR_DEPARTMENT_OTHER)
Admission: RE | Admit: 2022-12-05 | Discharge: 2022-12-05 | Disposition: A | Discharge status: Home / Self Care | Payer: Medicaid Other | Source: Ambulatory Visit | Attending: Orthopaedic Surgery | Admitting: Orthopaedic Surgery

## 2022-12-05 ENCOUNTER — Encounter (HOSPITAL_BASED_OUTPATIENT_CLINIC_OR_DEPARTMENT_OTHER): Payer: Self-pay | Admitting: Orthopaedic Surgery

## 2022-12-05 ENCOUNTER — Encounter (HOSPITAL_BASED_OUTPATIENT_CLINIC_OR_DEPARTMENT_OTHER)
Admission: RE | Disposition: A | Discharge status: Home / Self Care | Payer: Self-pay | Source: Ambulatory Visit | Attending: Orthopaedic Surgery

## 2022-12-05 ENCOUNTER — Ambulatory Visit (HOSPITAL_BASED_OUTPATIENT_CLINIC_OR_DEPARTMENT_OTHER): Payer: Medicaid Other | Admitting: Anesthesiology

## 2022-12-05 DIAGNOSIS — Z7984 Long term (current) use of oral hypoglycemic drugs: Secondary | ICD-10-CM | POA: Diagnosis not present

## 2022-12-05 DIAGNOSIS — E119 Type 2 diabetes mellitus without complications: Secondary | ICD-10-CM

## 2022-12-05 DIAGNOSIS — F17210 Nicotine dependence, cigarettes, uncomplicated: Secondary | ICD-10-CM

## 2022-12-05 DIAGNOSIS — J45909 Unspecified asthma, uncomplicated: Secondary | ICD-10-CM

## 2022-12-05 DIAGNOSIS — I10 Essential (primary) hypertension: Secondary | ICD-10-CM

## 2022-12-05 DIAGNOSIS — G5603 Carpal tunnel syndrome, bilateral upper limbs: Secondary | ICD-10-CM | POA: Diagnosis present

## 2022-12-05 DIAGNOSIS — G5602 Carpal tunnel syndrome, left upper limb: Secondary | ICD-10-CM | POA: Diagnosis present

## 2022-12-05 DIAGNOSIS — Z79899 Other long term (current) drug therapy: Secondary | ICD-10-CM | POA: Diagnosis not present

## 2022-12-05 HISTORY — PX: CARPAL TUNNEL RELEASE: SHX101

## 2022-12-05 LAB — GLUCOSE, CAPILLARY
Glucose-Capillary: 128 mg/dL — ABNORMAL HIGH (ref 70–99)
Glucose-Capillary: 121 mg/dL — ABNORMAL HIGH (ref 70–99)

## 2022-12-05 SURGERY — CARPAL TUNNEL RELEASE
Anesthesia: Monitor Anesthesia Care | Site: Wrist | Laterality: Left

## 2022-12-05 MED ORDER — ONDANSETRON HCL 4 MG/2ML IJ SOLN
INTRAMUSCULAR | Status: AC
  Filled 2022-12-05: qty 6

## 2022-12-05 MED ORDER — LACTATED RINGERS IV SOLN: INTRAVENOUS | Status: DC

## 2022-12-05 MED ORDER — 0.9 % SODIUM CHLORIDE (POUR BTL) OPTIME
TOPICAL | Status: DC | PRN
  Administered 2022-12-05: 100 mL

## 2022-12-05 MED ORDER — FENTANYL CITRATE (PF) 100 MCG/2ML IJ SOLN: 25.0000 ug | INTRAMUSCULAR | Status: DC | PRN

## 2022-12-05 MED ORDER — CEFAZOLIN SODIUM-DEXTROSE 2-4 GM/100ML-% IV SOLN
INTRAVENOUS | Status: AC
  Filled 2022-12-05: qty 100

## 2022-12-05 MED ORDER — LIDOCAINE 2% (20 MG/ML) 5 ML SYRINGE
INTRAMUSCULAR | Status: DC | PRN
  Administered 2022-12-05: 30 mg via INTRAVENOUS

## 2022-12-05 MED ORDER — LIDOCAINE 2% (20 MG/ML) 5 ML SYRINGE
INTRAMUSCULAR | Status: AC
  Filled 2022-12-05: qty 15

## 2022-12-05 MED ORDER — ACETAMINOPHEN 500 MG PO TABS
1000.0000 mg | ORAL_TABLET | Freq: Once | ORAL | Status: AC
  Administered 2022-12-05: 1000 mg via ORAL

## 2022-12-05 MED ORDER — ONDANSETRON HCL 4 MG/2ML IJ SOLN
INTRAMUSCULAR | Status: DC | PRN
  Administered 2022-12-05: 4 mg via INTRAVENOUS

## 2022-12-05 MED ORDER — LIDOCAINE HCL (PF) 1 % IJ SOLN
INTRAMUSCULAR | Status: AC
  Filled 2022-12-05: qty 30

## 2022-12-05 MED ORDER — FENTANYL CITRATE (PF) 100 MCG/2ML IJ SOLN
INTRAMUSCULAR | Status: DC | PRN
  Administered 2022-12-05: 50 ug via INTRAVENOUS

## 2022-12-05 MED ORDER — MIDAZOLAM HCL 5 MG/5ML IJ SOLN
INTRAMUSCULAR | Status: DC | PRN
  Administered 2022-12-05: 2 mg via INTRAVENOUS

## 2022-12-05 MED ORDER — BUPIVACAINE HCL (PF) 0.25 % IJ SOLN
INTRAMUSCULAR | Status: DC | PRN
  Administered 2022-12-05: 30 mL

## 2022-12-05 MED ORDER — MIDAZOLAM HCL 2 MG/2ML IJ SOLN
INTRAMUSCULAR | Status: AC
  Filled 2022-12-05: qty 2

## 2022-12-05 MED ORDER — DEXMEDETOMIDINE HCL IN NACL 80 MCG/20ML IV SOLN
INTRAVENOUS | Status: AC
  Filled 2022-12-05: qty 20

## 2022-12-05 MED ORDER — PHENYLEPHRINE 80 MCG/ML (10ML) SYRINGE FOR IV PUSH (FOR BLOOD PRESSURE SUPPORT)
PREFILLED_SYRINGE | INTRAVENOUS | Status: AC
  Filled 2022-12-05: qty 10

## 2022-12-05 MED ORDER — LIDOCAINE HCL (PF) 1 % IJ SOLN
INTRAMUSCULAR | Status: DC | PRN
  Administered 2022-12-05: 30 mL

## 2022-12-05 MED ORDER — CEFAZOLIN SODIUM-DEXTROSE 2-4 GM/100ML-% IV SOLN
2.0000 g | INTRAVENOUS | Status: AC
  Administered 2022-12-05: 2 g via INTRAVENOUS

## 2022-12-05 MED ORDER — HYDROCODONE-ACETAMINOPHEN 5-325 MG PO TABS: 1.0000 | ORAL_TABLET | Freq: Four times a day (QID) | ORAL | 0 refills | Status: DC | PRN

## 2022-12-05 MED ORDER — FENTANYL CITRATE (PF) 100 MCG/2ML IJ SOLN
INTRAMUSCULAR | Status: AC
  Filled 2022-12-05: qty 2

## 2022-12-05 MED ORDER — DEXMEDETOMIDINE HCL IN NACL 80 MCG/20ML IV SOLN
INTRAVENOUS | Status: DC | PRN
  Administered 2022-12-05: 8 ug via INTRAVENOUS

## 2022-12-05 MED ORDER — BUPIVACAINE HCL (PF) 0.25 % IJ SOLN
INTRAMUSCULAR | Status: AC
  Filled 2022-12-05: qty 30

## 2022-12-05 MED ORDER — ACETAMINOPHEN 500 MG PO TABS
ORAL_TABLET | ORAL | Status: AC
  Filled 2022-12-05: qty 2

## 2022-12-05 MED ORDER — PROPOFOL 500 MG/50ML IV EMUL
INTRAVENOUS | Status: DC | PRN
  Administered 2022-12-05: 100 ug/kg/min via INTRAVENOUS

## 2022-12-05 SURGICAL SUPPLY — 37 items
APL SKNCLS STERI-STRIP NONHPOA (GAUZE/BANDAGES/DRESSINGS)
BENZOIN TINCTURE PRP APPL 2/3 (GAUZE/BANDAGES/DRESSINGS) IMPLANT
BLADE SURG 15 STRL LF DISP TIS (BLADE) ×2 IMPLANT
BLADE SURG 15 STRL SS (BLADE) ×2
BNDG CMPR 5X4 KNIT ELC UNQ LF (GAUZE/BANDAGES/DRESSINGS) ×1
BNDG CMPR 9X4 STRL LF SNTH (GAUZE/BANDAGES/DRESSINGS) ×1
BNDG ELASTIC 4INX 5YD STR LF (GAUZE/BANDAGES/DRESSINGS) ×2 IMPLANT
BNDG ESMARK 4X9 LF (GAUZE/BANDAGES/DRESSINGS) ×2 IMPLANT
CORD BIPOLAR FORCEPS 12FT (ELECTRODE) ×2 IMPLANT
COVER BACK TABLE 60X90IN (DRAPES) ×2 IMPLANT
COVER MAYO STAND STRL (DRAPES) ×2 IMPLANT
CUFF TOURN SGL QUICK 18X4 (TOURNIQUET CUFF) ×2 IMPLANT
DRAPE EXTREMITY T 121X128X90 (DISPOSABLE) ×2 IMPLANT
DRAPE SURG 17X23 STRL (DRAPES) ×2 IMPLANT
DURAPREP 26ML APPLICATOR (WOUND CARE) ×2 IMPLANT
GAUZE SPONGE 4X4 12PLY STRL (GAUZE/BANDAGES/DRESSINGS) ×2 IMPLANT
GAUZE XEROFORM 1X8 LF (GAUZE/BANDAGES/DRESSINGS) ×2 IMPLANT
GLOVE BIO SURGEON STRL SZ7.5 (GLOVE) ×2 IMPLANT
GLOVE BIOGEL PI IND STRL 8 (GLOVE) ×2 IMPLANT
GOWN STRL REUS W/ TWL LRG LVL3 (GOWN DISPOSABLE) ×2 IMPLANT
GOWN STRL REUS W/ TWL XL LVL3 (GOWN DISPOSABLE) ×2 IMPLANT
GOWN STRL REUS W/TWL LRG LVL3 (GOWN DISPOSABLE) ×1
GOWN STRL REUS W/TWL XL LVL3 (GOWN DISPOSABLE) ×1
NDL HYPO 25X1 1.5 SAFETY (NEEDLE) ×2 IMPLANT
NEEDLE HYPO 25X1 1.5 SAFETY (NEEDLE) ×1 IMPLANT
NS IRRIG 1000ML POUR BTL (IV SOLUTION) ×2 IMPLANT
PACK BASIN DAY SURGERY FS (CUSTOM PROCEDURE TRAY) ×2 IMPLANT
PAD CAST 3X4 CTTN HI CHSV (CAST SUPPLIES) ×2 IMPLANT
PADDING CAST ABS COTTON 4X4 ST (CAST SUPPLIES) ×2 IMPLANT
PADDING CAST COTTON 3X4 STRL (CAST SUPPLIES) ×1
STOCKINETTE 4X48 STRL (DRAPES) ×2 IMPLANT
STRIP CLOSURE SKIN 1/2X4 (GAUZE/BANDAGES/DRESSINGS) IMPLANT
SUT ETHILON 3 0 PS 1 (SUTURE) ×2 IMPLANT
SYR BULB EAR ULCER 3OZ GRN STR (SYRINGE) ×2 IMPLANT
SYR CONTROL 10ML LL (SYRINGE) IMPLANT
TOWEL GREEN STERILE FF (TOWEL DISPOSABLE) ×2 IMPLANT
UNDERPAD 30X36 HEAVY ABSORB (UNDERPADS AND DIAPERS) ×2 IMPLANT

## 2022-12-05 NOTE — H&P (Signed)
Valerie [name] is an 42 y.o. female.   Chief Complaint: Left carpal tunnel syndrome HPI: 42 year old female with a severe bilateral carpal tunnel syndrome.  Electrical test were performed 09/21/2022.  She has failed splinting anti-inflammatories.  Electrical test showed severe bilateral median nerve entrapment at the wrist.  She underwent right carpal tunnel release 10/24/2022 doing well and now presents with a left carpal tunnel release.  Past Medical History:  Diagnosis Date   Anxiety    Asthma    Bacterial vaginosis    Bronchitis    Diabetes (HCC)    High cholesterol    Hypertension    Overweight    SVT (supraventricular tachycardia)     Past Surgical History:  Procedure Laterality Date   CARPAL TUNNEL RELEASE Right 10/24/2022   Procedure: RIGHT CARPAL TUNNEL RELEASE;  Surgeon: Eldred Manges, MD;  Location: [facility];  Service: Orthopedics;  Laterality: Right;   CESAREAN SECTION     INDUCED ABORTION     SVT ABLATION N/A 12/06/2018   Procedure: SVT ABLATION;  Surgeon: Hillis Range, MD;  [facility]: [facility];  Service: Cardiovascular;  Laterality: N/A;   TONSILLECTOMY     TUBAL LIGATION      Family History  Problem Relation Age of Onset   Diabetes Mother    Hypertension Mother    Diabetes Other    Hypertension Other    Social History:  reports that she has been smoking cigarettes. She has been smoking an average of .5 packs per day. She has never used smokeless tobacco. She reports current alcohol use. She reports that she does not use drugs.  Allergies: No Known Allergies  Medications Prior to Admission  Medication Sig Dispense Refill   albuterol (PROVENTIL HFA;VENTOLIN HFA) 108 (90 BASE) MCG/ACT inhaler Inhale 1-2 puffs into the lungs every 6 (six) hours as needed for wheezing or shortness of breath. 1 Inhaler 0   ALPRAZolam (XANAX) 1 MG tablet Take 1 mg by mouth 3 (three) times daily as needed for anxiety.     atorvastatin (LIPITOR) 10 MG  tablet Take 10 mg by mouth daily.     JARDIANCE 10 MG TABS tablet Take 10 mg by mouth daily.     metFORMIN (GLUCOPHAGE) 1000 MG tablet Take 500 mg by mouth daily with breakfast.     metoprolol succinate (TOPROL-XL) 25 MG 24 hr tablet Take 2 tablets (50 mg total) by mouth every morning.     traMADol (ULTRAM) 50 MG tablet Take 1 tablet (50 mg total) by mouth every 12 (twelve) hours as needed. 30 tablet 0   ondansetron (ZOFRAN-ODT) 4 MG disintegrating tablet Take 1 tablet (4 mg total) by mouth every 8 (eight) hours as needed for nausea. 10 tablet 0    Results for orders placed or performed during the hospital encounter of 12/05/22 (from the past 48 hour(s))  Glucose, capillary     Status: Abnormal   Collection Time: 12/05/22 10:08 AM  Result Value Ref Range   Glucose-Capillary 128 (H) 70 - 99 mg/dL    Comment: Glucose reference range applies only to samples taken after fasting for at least 8 hours.   No results found.  Review of Systems  Blood pressure 124/86, pulse 73, temperature 97.9 F (36.6 C), temperature source Temporal, resp. rate 16, height 5\' 6"  (1.676 m), weight 91.5 kg, last menstrual period 03/09/2018, SpO2 97 %. Physical Exam Constitutional:      Appearance: Normal appearance.  HENT:  Head: Normocephalic.     Right Ear: External ear normal.     Left Ear: External ear normal.  Eyes:     Extraocular Movements: Extraocular movements intact.  Cardiovascular:     Rate and Rhythm: Normal rate.     Pulses: Normal pulses.  Pulmonary:     Effort: Pulmonary effort is normal.  Abdominal:     Palpations: Abdomen is soft.  Musculoskeletal:        General: Normal range of motion.  Skin:    General: Skin is warm.  Neurological:     Mental Status: She is alert.   Positive Phalen's left well-healed carpal tunnel release incision right wrist.  Positive Tinel's of the left wrist.  Good cervical range of motion.  Assessment/Plan Left carpal tunnel syndrome presents for left  carpal tunnel release she is already had the right one done in May  Eldred Manges, MD 12/05/2022, 11:35 AM

## 2022-12-05 NOTE — Anesthesia Preprocedure Evaluation (Addendum)
Anesthesia Evaluation  Patient identified by MRN, date of birth, ID band Patient awake    Reviewed: Allergy & Precautions, NPO status , Patient's Chart, lab work & pertinent test results, reviewed documented beta blocker date and time   Airway Mallampati: II  TM Distance: >3 FB Neck ROM: Full    Dental no notable dental hx. (+) Teeth Intact, Dental Advisory Given   Pulmonary asthma , Current SmokerPatient did not abstain from smoking.   Pulmonary exam normal breath sounds clear to auscultation       Cardiovascular hypertension, Pt. on home beta blockers and Pt. on medications Normal cardiovascular exam+ dysrhythmias Supra Ventricular Tachycardia  Rhythm:Regular Rate:Normal     Neuro/Psych  PSYCHIATRIC DISORDERS Anxiety     negative neurological ROS     GI/Hepatic negative GI ROS, Neg liver ROS,,,  Endo/Other  diabetes, Type 2, Oral Hypoglycemic Agents    Renal/GU negative Renal ROS  negative genitourinary   Musculoskeletal  (+) Arthritis ,    Abdominal   Peds  Hematology negative hematology ROS (+)   Anesthesia Other Findings   Reproductive/Obstetrics                             Anesthesia Physical Anesthesia Plan  ASA: 2  Anesthesia Plan: MAC   Post-op Pain Management: Tylenol PO (pre-op)*   Induction: Intravenous  PONV Risk Score and Plan: 1 and Propofol infusion, Treatment may vary due to age or medical condition, Midazolam and Ondansetron  Airway Management Planned: Natural Airway  Additional Equipment:   Intra-op Plan:   Post-operative Plan:   Informed Consent: I have reviewed the patients History and Physical, chart, labs and discussed the procedure including the risks, benefits and alternatives for the proposed anesthesia with the patient or authorized representative who has indicated his/her understanding and acceptance.     Dental advisory given  Plan Discussed  with: CRNA  Anesthesia Plan Comments:        Anesthesia Quick Evaluation

## 2022-12-05 NOTE — Discharge Instructions (Addendum)
Keep hand elevated above heart level.  Office follow-up with Dr. Ophelia Charter in 1 week for dressing change.  You may use ice over the palm as needed for pain.  Pain medication has been sent into your pharmacy for postop pain.   Post Anesthesia Home Care Instructions  Activity: Get plenty of rest for the remainder of the day. A responsible individual must stay with you for 24 hours following the procedure.  For the next 24 hours, DO NOT: -Drive a car -Advertising copywriter -Drink alcoholic beverages -Take any medication unless instructed by your physician -Make any legal decisions or sign important papers.  Meals: Start with liquid foods such as gelatin or soup. Progress to regular foods as tolerated. Avoid greasy, spicy, heavy foods. If nausea and/or vomiting occur, drink only clear liquids until the nausea and/or vomiting subsides. Call your physician if vomiting continues.  Special Instructions/Symptoms: Your throat may feel dry or sore from the anesthesia or the breathing tube placed in your throat during surgery. If this causes discomfort, gargle with warm salt water. The discomfort should disappear within 24 hours.  If you had a scopolamine patch placed behind your ear for the management of post- operative nausea and/or vomiting:  1. The medication in the patch is effective for 72 hours, after which it should be removed.  Wrap patch in a tissue and discard in the trash. Wash hands thoroughly with soap and water. 2. You may remove the patch earlier than 72 hours if you experience unpleasant side effects which may include dry mouth, dizziness or visual disturbances. 3. Avoid touching the patch. Wash your hands with soap and water after contact with the patch.  No tylenol until after 4:15pm today if needed.

## 2022-12-05 NOTE — Anesthesia Postprocedure Evaluation (Signed)
Anesthesia Post Note  Patient: Valerie [name]  Procedure(s) Performed: LEFT CARPAL TUNNEL RELEASE (Left: Wrist)     Patient location during evaluation: PACU Anesthesia Type: MAC Level of consciousness: awake and alert Pain management: pain level controlled Vital Signs Assessment: post-procedure vital signs reviewed and stable Respiratory status: spontaneous breathing, nonlabored ventilation, respiratory function stable and patient connected to nasal cannula oxygen Cardiovascular status: stable and blood pressure returned to baseline Postop Assessment: no apparent nausea or vomiting Anesthetic complications: no  No notable events documented.  Last Vitals:  Vitals:   12/05/22 1300 12/05/22 1315  BP: 114/80 115/67  Pulse: 64 (!) 59  Resp: 17 16  Temp:  (!) 36.3 C  SpO2: 92% 95%    Last Pain:  Vitals:   12/05/22 1315  TempSrc:   PainSc: 0-No pain                 Byrd Terrero L Hephzibah Strehle

## 2022-12-05 NOTE — Anesthesia Procedure Notes (Signed)
Procedure Name: MAC Date/Time: 12/05/2022 12:20 PM  Performed by: Sheryn Bison, CRNAPre-anesthesia Checklist: Emergency Drugs available, Patient identified, Suction available, Patient being monitored and Timeout performed Patient Re-evaluated:Patient Re-evaluated prior to induction Oxygen Delivery Method: Simple face mask

## 2022-12-05 NOTE — Interval H&P Note (Signed)
History and Physical Interval Note:  12/05/2022 11:58 AM  Valerie [name]  has presented today for surgery, with the diagnosis of left carpal tunnel syndrome.  The various methods of treatment have been discussed with the patient and family. After consideration of risks, benefits and other options for treatment, the patient has consented to  Procedure(s): LEFT CARPAL TUNNEL RELEASE (Left) as a surgical intervention.  The patient's history has been reviewed, patient examined, no change in status, stable for surgery.  I have reviewed the patient's chart and labs.  Questions were answered to the patient's satisfaction.     Eldred Manges

## 2022-12-05 NOTE — Op Note (Signed)
Pre and postop diagnosis: Left carpal tunnel syndrome  Procedure left carpal tunnel release  Surgeon: Dennard Nip  Anesthesia MAC +6 cc Marcaine local  Tourniquet: Applied but not inflated.  Findings: Median nerve compression in the left carpal canal.  Chronic tenosynovitis present.  Procedure: After standard prepping and draping with IV sedation proximal forearm tourniquet DuraPrep no antibiotics are indicated sterile skin marker was used placed on landmarks ulnar border of the thumb and radial border of the ring finger.  Incision was made hemostasis with the bipolar transverse carpal ligament was identified divided along the ulnar aspect of the nerve.  This was continued into the distal forearm fascia until fingertip to be passed distally above the wrist joint with no areas of compression.  Median nerve was flat extremely erythematous.  Small veins arteries were coagulated with the bipolar and fingertip was introduced distally.  Irrigation operative field was dry.  Thenar motor branch had a distal radial takeoff.  Repeat irrigation closure of the skin with 3-0 nylon Xeroform 4 x 4's Marcaine infiltration total of 6 cc was done at the beginning of the case and then tween wrist 4 x 4's Webril and Ace wrap was applied for postoperative dressing patient tolerated procedure well transferred care room in stable condition.  Outpatient surgery is appropriate for treatment of this condition also follow-up 1 week.

## 2022-12-05 NOTE — Transfer of Care (Signed)
Immediate Anesthesia Transfer of Care Note  Patient: Kumiko L Furniss-Roe  Procedure(s) Performed: LEFT CARPAL TUNNEL RELEASE (Left: Wrist)  Patient Location: PACU  Anesthesia Type:MAC  Level of Consciousness: drowsy  Airway & Oxygen Therapy: Patient Spontanous Breathing and Patient connected to face mask oxygen  Post-op Assessment: Report given to RN and Post -op Vital signs reviewed and stable  Post vital signs: Reviewed and stable  Last Vitals:  Vitals Value Taken Time  BP    Temp    Pulse 68 12/05/22 1240  Resp 15   SpO2 99 % 12/05/22 1240  Vitals shown include unvalidated device data.  Last Pain:  Vitals:   12/05/22 1010  TempSrc: Temporal  PainSc: 1       Patients Stated Pain Goal: 3 (12/05/22 1010)  Complications: No notable events documented.

## 2022-12-06 ENCOUNTER — Encounter (HOSPITAL_BASED_OUTPATIENT_CLINIC_OR_DEPARTMENT_OTHER): Payer: Self-pay | Admitting: Orthopaedic Surgery

## 2022-12-12 ENCOUNTER — Other Ambulatory Visit (INDEPENDENT_AMBULATORY_CARE_PROVIDER_SITE_OTHER): Payer: Self-pay | Admitting: Orthopaedic Surgery

## 2022-12-14 ENCOUNTER — Encounter: Payer: Self-pay | Admitting: Orthopaedic Surgery

## 2022-12-14 ENCOUNTER — Ambulatory Visit (INDEPENDENT_AMBULATORY_CARE_PROVIDER_SITE_OTHER): Payer: Medicaid Other | Admitting: Orthopaedic Surgery

## 2022-12-14 VITALS — Ht 66.0 in | Wt 201.0 lb

## 2022-12-14 DIAGNOSIS — G5602 Carpal tunnel syndrome, left upper limb: Secondary | ICD-10-CM

## 2022-12-14 MED ORDER — TRAMADOL HCL 50 MG PO TABS: 50.0000 mg | ORAL_TABLET | Freq: Four times a day (QID) | ORAL | 0 refills | Status: DC | PRN

## 2022-12-14 NOTE — Progress Notes (Signed)
Post-Op Visit Note   Patient: Valerie [name]           Date of Birth: Dec 03, 1980           MRN: [id number] Visit Date: 12/14/2022 PCP: [name], PA-C   Assessment & Plan: Follow-up carpal tunnel release.  Final prescription tramadol 50 mg #20 sent in.  She will return in 1 week for suture removal left hand.  Right hand is doing better it bothers her some with driving.  Chief Complaint:  Chief Complaint  Patient presents with   Left Hand - Routine Post Op    12/05/2022 left CTR   Visit Diagnoses:  1. Carpal tunnel syndrome, left upper limb     Plan: Return in 1 week for left hand suture removal.  Follow-Up Instructions: No follow-ups on file.   Orders:  No orders of the defined types were placed in this encounter.  Meds ordered this encounter  Medications   traMADol (ULTRAM) 50 MG tablet    Sig: Take 1 tablet (50 mg total) by mouth every 6 (six) hours as needed.    Dispense:  20 tablet    Refill:  0    Imaging: No results found.  PMFS History: Patient Active Problem List   Diagnosis Date Noted   Carpal tunnel syndrome, right upper limb 10/24/2022   Carpal tunnel syndrome, left upper limb 09/29/2022   Bilateral hand numbness 08/19/2022   Chondromalacia patellae, right knee 12/26/2019   Lipoma of left shoulder 10/17/2019   Anterior knee pain, right 08/26/2019   Asthma 06/15/2016   Type 2 diabetes mellitus (HCC) 06/15/2016   Other abnormal glucose 06/28/2015   Hidradenitis 06/28/2015   Past Medical History:  Diagnosis Date   Anxiety    Asthma    Bacterial vaginosis    Bronchitis    Diabetes (HCC)    High cholesterol    Hypertension    Overweight    SVT (supraventricular tachycardia)     Family History  Problem Relation Age of Onset   Diabetes Mother    Hypertension Mother    Diabetes Other    Hypertension Other     Past Surgical History:  Procedure Laterality Date   CARPAL TUNNEL RELEASE Right 10/24/2022   Procedure: RIGHT CARPAL TUNNEL  RELEASE;  Surgeon: Eldred Manges, MD;  Location: [facility];  Service: Orthopedics;  Laterality: Right;   CARPAL TUNNEL RELEASE Left 12/05/2022   Procedure: LEFT CARPAL TUNNEL RELEASE;  Surgeon: Eldred Manges, MD;  Location: [facility];  Service: Orthopedics;  Laterality: Left;   CESAREAN SECTION     INDUCED ABORTION     SVT ABLATION N/A 12/06/2018   Procedure: SVT ABLATION;  Surgeon: Hillis Range, MD;  Location: [facility];  Service: Cardiovascular;  Laterality: N/A;   TONSILLECTOMY     TUBAL LIGATION     Social History   Occupational History   Not on file  Tobacco Use   Smoking status: Every Day    Packs/day: .5    Types: Cigarettes   Smokeless tobacco: Never   Tobacco comments:    almost 1 pack daily  Vaping Use   Vaping Use: Never used  Substance and Sexual Activity   Alcohol use: Yes    Comment: rare   Drug use: No   Sexual activity: Never    Birth control/protection: Surgical

## 2022-12-19 ENCOUNTER — Other Ambulatory Visit: Payer: Self-pay | Admitting: Orthopaedic Surgery

## 2022-12-20 ENCOUNTER — Other Ambulatory Visit: Payer: Self-pay | Admitting: Orthopaedic Surgery

## 2022-12-21 ENCOUNTER — Ambulatory Visit (INDEPENDENT_AMBULATORY_CARE_PROVIDER_SITE_OTHER): Payer: Medicaid Other | Admitting: Orthopaedic Surgery

## 2022-12-21 ENCOUNTER — Encounter: Payer: Self-pay | Admitting: Orthopaedic Surgery

## 2022-12-21 DIAGNOSIS — G5601 Carpal tunnel syndrome, right upper limb: Secondary | ICD-10-CM

## 2022-12-21 DIAGNOSIS — G5602 Carpal tunnel syndrome, left upper limb: Secondary | ICD-10-CM

## 2022-12-21 MED ORDER — TRAMADOL HCL 50 MG PO TABS: 50.0000 mg | ORAL_TABLET | Freq: Two times a day (BID) | ORAL | 0 refills | Status: DC | PRN

## 2022-12-21 NOTE — Progress Notes (Signed)
Post-Op Visit Note   Patient: Valerie [name]           Date of Birth: 1980-10-12           MRN: [id number] Visit Date: 12/21/2022 PCP: [name], PA-C   Assessment & Plan: Follow-up left carpal tunnel release she is doing everything with the right hand which had previous carpal tunnel release several weeks ago.  Final 10 tablets of tramadol sent in.  Last year she was on oxycodone pain management.  She has noticed improvement in sensation in the hand and decreased pain but still has a problem since both hands are involved.  Chief Complaint:  Chief Complaint  Patient presents with   Left Wrist - Routine Post Op    Postop CTR 2 weeks, planned sutures out today, incision red/please check prior to removal.   Visit Diagnoses:  1. Carpal tunnel syndrome, left upper limb   2. Carpal tunnel syndrome, right upper limb     Plan: return PRN  Follow-Up Instructions: No follow-ups on file.   Orders:  No orders of the defined types were placed in this encounter.  Meds ordered this encounter  Medications   traMADol (ULTRAM) 50 MG tablet    Sig: Take 1 tablet (50 mg total) by mouth every 12 (twelve) hours as needed.    Dispense:  10 tablet    Refill:  0    Post op pain    Imaging: No results found.  PMFS History: Patient Active Problem List   Diagnosis Date Noted   Carpal tunnel syndrome, right upper limb 10/24/2022   Carpal tunnel syndrome, left upper limb 09/29/2022   Bilateral hand numbness 08/19/2022   Chondromalacia patellae, right knee 12/26/2019   Lipoma of left shoulder 10/17/2019   Anterior knee pain, right 08/26/2019   Asthma 06/15/2016   Type 2 diabetes mellitus (HCC) 06/15/2016   Other abnormal glucose 06/28/2015   Hidradenitis 06/28/2015   Past Medical History:  Diagnosis Date   Anxiety    Asthma    Bacterial vaginosis    Bronchitis    Diabetes (HCC)    High cholesterol    Hypertension    Overweight    SVT (supraventricular tachycardia)      Family History  Problem Relation Age of Onset   Diabetes Mother    Hypertension Mother    Diabetes Other    Hypertension Other     Past Surgical History:  Procedure Laterality Date   CARPAL TUNNEL RELEASE Right 10/24/2022   Procedure: RIGHT CARPAL TUNNEL RELEASE;  Surgeon: Eldred Manges, MD;  Location: [facility];  Service: Orthopedics;  Laterality: Right;   CARPAL TUNNEL RELEASE Left 12/05/2022   Procedure: LEFT CARPAL TUNNEL RELEASE;  Surgeon: Eldred Manges, MD;  Location: [facility];  Service: Orthopedics;  Laterality: Left;   CESAREAN SECTION     INDUCED ABORTION     SVT ABLATION N/A 12/06/2018   Procedure: SVT ABLATION;  Surgeon: Hillis Range, MD;  Location: [facility];  Service: Cardiovascular;  Laterality: N/A;   TONSILLECTOMY     TUBAL LIGATION     Social History   Occupational History   Not on file  Tobacco Use   Smoking status: Every Day    Packs/day: .5    Types: Cigarettes   Smokeless tobacco: Never   Tobacco comments:    almost 1 pack daily  Vaping Use   Vaping Use: Never used  Substance and Sexual Activity  Alcohol use: Yes    Comment: rare   Drug use: No   Sexual activity: Never    Birth control/protection: Surgical

## 2022-12-26 ENCOUNTER — Other Ambulatory Visit: Payer: Self-pay | Admitting: Orthopaedic Surgery

## 2023-03-08 ENCOUNTER — Telehealth: Payer: Self-pay | Admitting: Orthopaedic Surgery

## 2023-03-08 NOTE — Telephone Encounter (Signed)
Called pt and left vm for pt to call Crystal to set an [place] office appt.

## 2023-03-30 ENCOUNTER — Ambulatory Visit: Payer: Medicaid Other | Admitting: Orthopaedic Surgery

## 2023-03-30 DIAGNOSIS — G5601 Carpal tunnel syndrome, right upper limb: Secondary | ICD-10-CM | POA: Diagnosis not present

## 2023-03-30 DIAGNOSIS — G5602 Carpal tunnel syndrome, left upper limb: Secondary | ICD-10-CM

## 2023-03-30 DIAGNOSIS — M2241 Chondromalacia patellae, right knee: Secondary | ICD-10-CM

## 2023-03-30 MED ORDER — BUPIVACAINE HCL 0.25 % IJ SOLN
4.0000 mL | INTRAMUSCULAR | Status: AC | PRN
Start: 2023-03-30 — End: 2023-03-30
  Administered 2023-03-30: 4 mL via INTRA_ARTICULAR

## 2023-03-30 MED ORDER — LIDOCAINE HCL 1 % IJ SOLN
0.5000 mL | INTRAMUSCULAR | Status: AC | PRN
Start: 2023-03-30 — End: 2023-03-30
  Administered 2023-03-30: .5 mL

## 2023-03-30 MED ORDER — METHYLPREDNISOLONE ACETATE 40 MG/ML IJ SUSP
40.0000 mg | INTRAMUSCULAR | Status: AC | PRN
Start: 2023-03-30 — End: 2023-03-30
  Administered 2023-03-30: 40 mg via INTRA_ARTICULAR

## 2023-03-30 NOTE — Progress Notes (Signed)
Office Visit Note   Patient: Valerie [name]           Date of Birth: [birth year]           MRN: [id number] Visit Date: 03/30/2023              Requested by: [name], PA-C [address] [place],  [place] [address] PCP: [name], PA-C   Assessment & Plan: Visit Diagnoses:  1. Carpal tunnel syndrome, left upper limb   2. Carpal tunnel syndrome, right upper limb   3. Chondromalacia patellae, right knee     Plan: Right knee injected at her request she will follow-up as needed we discussed firmness adjacent incision should improve with time as her opposite hand did.  She can follow-up with me as needed.  Follow-Up Instructions: No follow-ups on file.   Orders:  Orders Placed This Encounter  Procedures   Large Joint Inj   No orders of the defined types were placed in this encounter.     Procedures: Large Joint Inj: R knee on 03/30/2023 10:18 AM Indications: pain and joint swelling Details: 22 G 1.5 in needle, anterolateral approach  Arthrogram: No  Medications: 40 mg methylPREDNISolone acetate 40 MG/ML; 0.5 mL lidocaine 1 %; 4 mL bupivacaine 0.25 % Outcome: tolerated well, no immediate complications Procedure, treatment alternatives, risks and benefits explained, specific risks discussed. Consent was given by the patient. Immediately prior to procedure a time out was called to verify the correct patient, procedure, equipment, support staff and site/side marked as required. Patient was prepped and draped in the usual sterile fashion.       Clinical Data: No additional findings.   Subjective: No chief complaint on file.   HPI 42 year old female returns she had carpal tunnel release both hands.  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  She has noticed some slight hardness adjacent incision near the thenar eminence good relief of preop numbness problems.  She has had increased problems with her right knee is requesting  an injection.  She denies fever or chills.  Review of Systems all other systems noncontributory to HPI positive for diabetes.   Objective: Vital Signs: LMP 03/09/2018   Physical Exam Constitutional:      Appearance: She is well-developed.  HENT:     Head: Normocephalic.     Right Ear: External ear normal.     Left Ear: External ear normal. There is no impacted cerumen.  Eyes:     Pupils: Pupils are equal, round, and reactive to light.  Neck:     Thyroid: No thyromegaly.     Trachea: No tracheal deviation.  Cardiovascular:     Rate and Rhythm: Normal rate.  Pulmonary:     Effort: Pulmonary effort is normal.  Abdominal:     Palpations: Abdomen is soft.  Musculoskeletal:     Cervical back: No rigidity.  Skin:    General: Skin is warm and dry.  Neurological:     Mental Status: She is alert and oriented to person, place, and time.  Psychiatric:        Behavior: Behavior normal.     Ortho Exam incision is well-healed.  Slightly more firmness adjacent to the the left hand thenar region adjacent carpal tunnel in the opposite right hand that was done earlier.  Crepitus with knee range of motion positive patellofemoral loading.  Trace right knee effusion.  ACL PCL exam is normal.  Negative logroll hips.  Specialty Comments:  No specialty comments available.  Imaging: No results found.   PMFS History: Patient Active Problem List   Diagnosis Date Noted   Carpal tunnel syndrome, right upper limb 10/24/2022   Carpal tunnel syndrome, left upper limb 09/29/2022   Bilateral hand numbness 08/19/2022   Chondromalacia patellae, right knee 12/26/2019   Lipoma of left shoulder 10/17/2019   Anterior knee pain, right 08/26/2019   Asthma 06/15/2016   Type 2 diabetes mellitus (HCC) 06/15/2016   Other abnormal glucose 06/28/2015   Hidradenitis 06/28/2015   Past Medical History:  Diagnosis Date   Anxiety    Asthma    Bacterial vaginosis    Bronchitis    Diabetes (HCC)    High  cholesterol    Hypertension    Overweight    SVT (supraventricular tachycardia)     Family History  Problem Relation Age of Onset   Diabetes Mother    Hypertension Mother    Diabetes Other    Hypertension Other     Past Surgical History:  Procedure Laterality Date   CARPAL TUNNEL RELEASE Right 10/24/2022   Procedure: RIGHT CARPAL TUNNEL RELEASE;  Surgeon: Eldred Manges, MD;  Location: [facility];  Service: Orthopedics;  Laterality: Right;   CARPAL TUNNEL RELEASE Left 12/05/2022   Procedure: LEFT CARPAL TUNNEL RELEASE;  Surgeon: Eldred Manges, MD;  Location: [facility];  Service: Orthopedics;  Laterality: Left;   CESAREAN SECTION     INDUCED ABORTION     SVT ABLATION N/A 12/06/2018   Procedure: SVT ABLATION;  Surgeon: Hillis Range, MD;  [facility]: [facility];  Service: Cardiovascular;  Laterality: N/A;   TONSILLECTOMY     TUBAL LIGATION     Social History   Occupational History   Not on file  Tobacco Use   Smoking status: Every Day    Current packs/day: 0.50    Types: Cigarettes   Smokeless tobacco: Never   Tobacco comments:    almost 1 pack daily  Vaping Use   Vaping status: Never Used  Substance and Sexual Activity   Alcohol use: Yes    Comment: rare   Drug use: No   Sexual activity: Never    Birth control/protection: Surgical

## 2023-06-20 ENCOUNTER — Ambulatory Visit
Admission: EM | Admit: 2023-06-20 | Discharge: 2023-06-20 | Disposition: A | Discharge status: Home / Self Care | Payer: Medicaid Other | Attending: Family Medicine | Admitting: Family Medicine

## 2023-06-20 DIAGNOSIS — J22 Unspecified acute lower respiratory infection: Secondary | ICD-10-CM | POA: Diagnosis not present

## 2023-06-20 DIAGNOSIS — J4521 Mild intermittent asthma with (acute) exacerbation: Secondary | ICD-10-CM | POA: Diagnosis not present

## 2023-06-20 MED ORDER — AZITHROMYCIN 250 MG PO TABS: ORAL_TABLET | ORAL | 0 refills | Status: AC

## 2023-06-20 MED ORDER — PROMETHAZINE-DM 6.25-15 MG/5ML PO SYRP: 5.0000 mL | ORAL_SOLUTION | Freq: Four times a day (QID) | ORAL | 0 refills | Status: AC | PRN

## 2023-06-20 MED ORDER — DEXAMETHASONE SODIUM PHOSPHATE 10 MG/ML IJ SOLN
10.0000 mg | Freq: Once | INTRAMUSCULAR | Status: AC
  Administered 2023-06-20: 10 mg via INTRAMUSCULAR

## 2023-06-20 NOTE — ED Provider Notes (Signed)
 [facility]    CSN: [id number] Arrival date & time: 06/20/23  1434      History   Chief Complaint No chief complaint on file.   HPI Valerie [name] is a 43 y.o. female.   Patient presenting today with over 1 week history of cough, congestion, sore throat, and states last night started having progressively worsening cough, chest tightness, shortness of breath.  Denies chest pain, abdominal pain, nausea vomiting or diarrhea, fevers.  History of asthma on albuterol  as needed.  No known sick contacts recently.    Past Medical History:  Diagnosis Date   Anxiety    Asthma    Bacterial vaginosis    Bronchitis    Diabetes (HCC)    High cholesterol    Hypertension    Overweight    SVT (supraventricular tachycardia) [facility])     Patient Active Problem List   Diagnosis Date Noted   Carpal tunnel syndrome, right upper limb 10/24/2022   Carpal tunnel syndrome, left upper limb 09/29/2022   Bilateral hand numbness 08/19/2022   Chondromalacia patellae, right knee 12/26/2019   Lipoma of left shoulder 10/17/2019   Anterior knee pain, right 08/26/2019   Asthma 06/15/2016   Type 2 diabetes mellitus (HCC) 06/15/2016   Other abnormal glucose 06/28/2015   Hidradenitis 06/28/2015    Past Surgical History:  Procedure Laterality Date   CARPAL TUNNEL RELEASE Right 10/24/2022   Procedure: RIGHT CARPAL TUNNEL RELEASE;  Surgeon: Barbarann Oneil BROCKS, MD;  Location: [facility];  Service: Orthopedics;  Laterality: Right;   CARPAL TUNNEL RELEASE Left 12/05/2022   Procedure: LEFT CARPAL TUNNEL RELEASE;  Surgeon: Barbarann Oneil BROCKS, MD;  Location: [facility];  Service: Orthopedics;  Laterality: Left;   CESAREAN SECTION     INDUCED ABORTION     SVT ABLATION N/A 12/06/2018   Procedure: SVT ABLATION;  Surgeon: Kelsie Agent, MD;  [facility];  Service: Cardiovascular;  Laterality: N/A;   TONSILLECTOMY     TUBAL LIGATION      OB History     Gravida   3   Para  2   Term  2   Preterm      AB  1   Living  2      SAB      IAB  1   Ectopic      Multiple      Live Births               Home Medications    Prior to Admission medications   Medication Sig Start Date End Date Taking? Authorizing Provider  azithromycin  (ZITHROMAX ) 250 MG tablet Take first 2 tablets together, then 1 every day until finished. 06/20/23  Yes Stuart Vernell Norris, PA-C  promethazine -dextromethorphan (PROMETHAZINE -DM) 6.25-15 MG/5ML syrup Take 5 mLs by mouth 4 (four) times daily as needed. 06/20/23  Yes Stuart Vernell Norris, PA-C  albuterol  (PROVENTIL  HFA;VENTOLIN  HFA) 108 (90 BASE) MCG/ACT inhaler Inhale 1-2 puffs into the lungs every 6 (six) hours as needed for wheezing or shortness of breath. 06/26/14   Jamelle Lorrayne HERO, NP  ALPRAZolam (XANAX) 1 MG tablet Take 1 mg by mouth 3 (three) times daily as needed for anxiety.    [provider]  atorvastatin (LIPITOR) 10 MG tablet Take 10 mg by mouth daily.    [provider]  JARDIANCE 10 MG TABS tablet Take 10 mg by mouth daily. 07/22/22   [provider]  metFORMIN (GLUCOPHAGE)  1000 MG tablet Take 500 mg by mouth daily with breakfast.    [provider]  metoprolol  succinate (TOPROL -XL) 25 MG 24 hr tablet Take 2 tablets (50 mg total) by mouth every morning. 09/25/20   Richarda Prentice LITTIE Mickey., NP  ondansetron  (ZOFRAN -ODT) 4 MG disintegrating tablet Take 1 tablet (4 mg total) by mouth every 8 (eight) hours as needed for nausea. 11/08/21   Cleotilde Rogue, MD  traMADol  (ULTRAM ) 50 MG tablet Take 1 tablet (50 mg total) by mouth every 12 (twelve) hours as needed. 12/21/22   Barbarann Oneil BROCKS, MD    Family History Family History  Problem Relation Age of Onset   Diabetes Mother    Hypertension Mother    Diabetes Other    Hypertension Other     Social History Social History   Tobacco Use   Smoking status: Every Day    Current packs/day: 0.50    Types: Cigarettes   Smokeless  tobacco: Never   Tobacco comments:    almost 1 pack daily  Vaping Use   Vaping status: Never Used  Substance Use Topics   Alcohol use: Yes    Comment: rare   Drug use: No     Allergies   Patient has no known allergies.   Review of Systems Review of Systems Per HPI  Physical Exam Triage Vital Signs ED Triage Vitals  Encounter Vitals Group     BP 06/20/23 1447 131/84     Systolic BP Percentile --      Diastolic BP Percentile --      Pulse Rate 06/20/23 1447 95     Resp 06/20/23 1447 16     Temp 06/20/23 1447 98.3 F (36.8 C)     Temp Source 06/20/23 1447 Oral     SpO2 06/20/23 1447 94 %     Weight --      Height --      Head Circumference --      Peak Flow --      Pain Score 06/20/23 1451 0     Pain Loc --      Pain Education --      Exclude from Growth Chart --    No data found.  Updated Vital Signs BP 131/84 (BP Location: Right Arm)   Pulse 95   Temp 98.3 F (36.8 C) (Oral)   Resp 16   LMP 03/09/2018   SpO2 94%   Visual Acuity Right Eye Distance:   Left Eye Distance:   Bilateral Distance:    Right Eye Near:   Left Eye Near:    Bilateral Near:     Physical Exam Vitals and nursing note reviewed.  Constitutional:      Appearance: Normal appearance.  HENT:     Head: Atraumatic.     Right Ear: Tympanic membrane and external ear normal.     Left Ear: Tympanic membrane and external ear normal.     Nose: Congestion present.     Mouth/Throat:     Mouth: Mucous membranes are moist.     Pharynx: Posterior oropharyngeal erythema present.  Eyes:     Extraocular Movements: Extraocular movements intact.     Conjunctiva/sclera: Conjunctivae normal.  Cardiovascular:     Rate and Rhythm: Normal rate and regular rhythm.     Heart sounds: Normal heart sounds.  Pulmonary:     Effort: Pulmonary effort is normal.     Breath sounds: Wheezing present.  Musculoskeletal:  General: Normal range of motion.     Cervical back: Normal range of motion and  neck supple.  Skin:    General: Skin is warm and dry.  Neurological:     Mental Status: She is alert and oriented to person, place, and time.  Psychiatric:        Mood and Affect: Mood normal.        Thought Content: Thought content normal.      UC Treatments / Results  Labs (all labs ordered are listed, but only abnormal results are displayed) Labs Reviewed - No data to display  EKG   Radiology No results found.  Procedures Procedures (including critical care time)  Medications Ordered in UC Medications  dexamethasone  (DECADRON ) injection 10 mg (10 mg Intramuscular Given 06/20/23 1628)    Initial Impression / Assessment and Plan / UC Course  I have reviewed the triage vital signs and the nursing notes.  Pertinent labs & imaging results that were available during my care of the patient were reviewed by me and considered in my medical decision making (see chart for details).     Treat with Zithromax , IM Decadron , Phenergan  DM, albuterol  as needed and return for worsening symptoms.  Final Clinical Impressions(s) / UC Diagnoses   Final diagnoses:  Lower respiratory infection  Mild intermittent asthma with acute exacerbation   Discharge Instructions   None    ED Prescriptions     Medication Sig Dispense Auth. Provider   promethazine -dextromethorphan (PROMETHAZINE -DM) 6.25-15 MG/5ML syrup Take 5 mLs by mouth 4 (four) times daily as needed. 100 mL Stuart Vernell Norris, PA-C   azithromycin  (ZITHROMAX ) 250 MG tablet Take first 2 tablets together, then 1 every day until finished. 6 tablet Stuart Vernell Norris, [place]      PDMP not reviewed this encounter.   Stuart Vernell Norris, [place] 06/20/23 1947

## 2023-06-20 NOTE — ED Triage Notes (Signed)
 Pt reports sore throat, cough, congestion, coughing up phlegm x 1 week. States last night  she began to cough so hard she couldn't catch her breath.

## 2023-06-23 ENCOUNTER — Other Ambulatory Visit: Payer: Self-pay | Admitting: Family Medicine

## 2023-07-17 ENCOUNTER — Other Ambulatory Visit: Payer: Self-pay

## 2023-07-17 ENCOUNTER — Emergency Department (HOSPITAL_COMMUNITY)
Admission: EM | Admit: 2023-07-17 | Discharge: 2023-07-17 | Disposition: A | Discharge status: Home / Self Care | Payer: Medicaid Other | Attending: Emergency Medicine | Admitting: Emergency Medicine

## 2023-07-17 ENCOUNTER — Emergency Department (HOSPITAL_COMMUNITY): Payer: Medicaid Other

## 2023-07-17 ENCOUNTER — Encounter (HOSPITAL_COMMUNITY): Payer: Self-pay | Admitting: Emergency Medicine

## 2023-07-17 DIAGNOSIS — Z7984 Long term (current) use of oral hypoglycemic drugs: Secondary | ICD-10-CM | POA: Diagnosis not present

## 2023-07-17 DIAGNOSIS — I10 Essential (primary) hypertension: Secondary | ICD-10-CM | POA: Diagnosis not present

## 2023-07-17 DIAGNOSIS — R519 Headache, unspecified: Secondary | ICD-10-CM | POA: Insufficient documentation

## 2023-07-17 DIAGNOSIS — R202 Paresthesia of skin: Secondary | ICD-10-CM | POA: Insufficient documentation

## 2023-07-17 DIAGNOSIS — E119 Type 2 diabetes mellitus without complications: Secondary | ICD-10-CM | POA: Insufficient documentation

## 2023-07-17 DIAGNOSIS — Z79899 Other long term (current) drug therapy: Secondary | ICD-10-CM | POA: Diagnosis not present

## 2023-07-17 LAB — CBC WITH DIFFERENTIAL/PLATELET
Abs Immature Granulocytes: 0.02 10*3/uL (ref 0.00–0.07)
Basophils Absolute: 0.1 10*3/uL (ref 0.0–0.1)
Basophils Relative: 1 %
Eosinophils Absolute: 0.3 10*3/uL (ref 0.0–0.5)
Eosinophils Relative: 4 %
HCT: 45.3 % (ref 36.0–46.0)
Hemoglobin: 15.1 g/dL — ABNORMAL HIGH (ref 12.0–15.0)
Immature Granulocytes: 0 %
Lymphocytes Relative: 46 %
Lymphs Abs: 3.4 10*3/uL (ref 0.7–4.0)
MCH: 29.4 pg (ref 26.0–34.0)
MCHC: 33.3 g/dL (ref 30.0–36.0)
MCV: 88.1 fL (ref 80.0–100.0)
Monocytes Absolute: 0.4 10*3/uL (ref 0.1–1.0)
Monocytes Relative: 6 %
Neutro Abs: 3.1 10*3/uL (ref 1.7–7.7)
Neutrophils Relative %: 43 %
Platelets: 267 10*3/uL (ref 150–400)
RBC: 5.14 MIL/uL — ABNORMAL HIGH (ref 3.87–5.11)
RDW: 12.6 % (ref 11.5–15.5)
WBC: 7.3 10*3/uL (ref 4.0–10.5)
nRBC: 0 % (ref 0.0–0.2)

## 2023-07-17 LAB — BASIC METABOLIC PANEL
Anion gap: 11 (ref 5–15)
BUN: 14 mg/dL (ref 6–20)
CO2: 25 mmol/L (ref 22–32)
Calcium: 9.1 mg/dL (ref 8.9–10.3)
Chloride: 103 mmol/L (ref 98–111)
Creatinine, Ser: 0.82 mg/dL (ref 0.44–1.00)
GFR, Estimated: >60 mL/min (ref >60)
Glucose, Bld: 158 mg/dL — ABNORMAL HIGH (ref 70–99)
Potassium: 4 mmol/L (ref 3.5–5.1)
Sodium: 139 mmol/L (ref 135–145)

## 2023-07-17 MED ORDER — IOHEXOL 300 MG/ML  SOLN
75.0000 mL | Freq: Once | INTRAMUSCULAR | Status: AC | PRN
  Administered 2023-07-17: 75 mL via INTRAVENOUS

## 2023-07-17 NOTE — ED Notes (Signed)
PA triplett talking to pt in triage about discharge. Will print papers and will d/c papers when ready.

## 2023-07-17 NOTE — ED Triage Notes (Signed)
Pt reports her vision became blurry and seeing spots around 430 today. Pt then having headache and lip tingling. Denies fevers, emesis.

## 2023-07-17 NOTE — ED Provider Notes (Cosign Needed)
[place] EMERGENCY DEPARTMENT AT [facility] Provider Note   CSN: [id number] Arrival date & time: 07/17/23  1823     History  Chief Complaint  Patient presents with   Headache    Liba L Montella is a 43 y.o. female.   Headache Associated symptoms: no abdominal pain, no diarrhea, no dizziness, no fever, no nausea, no neck pain, no numbness, no seizures, no vomiting and no weakness        Cynethia L Ruddell is a 43 y.o. female past medical history of type 2 diabetes, hypertension and history of SVT with ablation in 2020 who presents to the Emergency Department complaining of sudden onset of frontal headache, visual auras, numbness tingling of her lips and left arm.  Symptoms began at 430 this afternoon.  Numbness and tingling resolved spontaneously after 30 minutes.  She continues to have frontal headache.  Took 2 Tylenol before arrival without improvement.  Notes history of occasional headaches but no significant migraine history.  She denies any recent illness, neck pain or stiffness, numbness or weakness of her extremities facial weakness or speech changes.  Home Medications Prior to Admission medications   Medication Sig Start Date End Date Taking? Authorizing Provider  albuterol (PROVENTIL HFA;VENTOLIN HFA) 108 (90 BASE) MCG/ACT inhaler Inhale 1-2 puffs into the lungs every 6 (six) hours as needed for wheezing or shortness of breath. 06/26/14   Janne Napoleon, NP  ALPRAZolam Prudy Feeler) 1 MG tablet Take 1 mg by mouth 3 (three) times daily as needed for anxiety.    [provider]  atorvastatin (LIPITOR) 10 MG tablet Take 10 mg by mouth daily.    [provider]  azithromycin (ZITHROMAX) 250 MG tablet Take first 2 tablets together, then 1 every day until finished. 06/20/23   Particia Nearing, PA-C  JARDIANCE 10 MG TABS tablet Take 10 mg by mouth daily. 07/22/22   [provider]  metFORMIN (GLUCOPHAGE) 1000 MG tablet Take 500 mg by mouth  daily with breakfast.    [provider]  metoprolol succinate (TOPROL-XL) 25 MG 24 hr tablet Take 2 tablets (50 mg total) by mouth every morning. 09/25/20   Netta Neat., NP  ondansetron (ZOFRAN-ODT) 4 MG disintegrating tablet Take 1 tablet (4 mg total) by mouth every 8 (eight) hours as needed for nausea. 11/08/21   Eber Hong, MD  promethazine-dextromethorphan (PROMETHAZINE-DM) 6.25-15 MG/5ML syrup Take 5 mLs by mouth 4 (four) times daily as needed. 06/20/23   Particia Nearing, PA-C  traMADol (ULTRAM) 50 MG tablet Take 1 tablet (50 mg total) by mouth every 12 (twelve) hours as needed. 12/21/22   Eldred Manges, MD      Allergies    Patient has no known allergies.    Review of Systems   Review of Systems  Constitutional:  Negative for appetite change, chills and fever.  Respiratory:  Negative for shortness of breath.   Cardiovascular:  Negative for chest pain.  Gastrointestinal:  Negative for abdominal pain, diarrhea, nausea and vomiting.  Musculoskeletal:  Negative for arthralgias and neck pain.  Neurological:  Positive for headaches. Negative for dizziness, seizures, syncope, weakness and numbness.  Psychiatric/Behavioral:  Negative for confusion.     Physical Exam Updated Vital Signs BP 132/83   Pulse 67   Temp 97.8 F (36.6 C) (Oral)   Resp 16   Ht 5\' 7"  (1.702 m)   Wt 95.3 kg   LMP 03/09/2018   SpO2 98%   BMI 32.89  kg/m  Physical Exam Vitals and nursing note reviewed.  Constitutional:      General: She is not in acute distress.    Appearance: She is well-developed. She is not ill-appearing.  HENT:     Head: Atraumatic.     Mouth/Throat:     Mouth: Mucous membranes are moist.  Eyes:     Extraocular Movements: Extraocular movements intact.     Conjunctiva/sclera: Conjunctivae normal.     Pupils: Pupils are equal, round, and reactive to light.  Neck:     Trachea: Phonation normal.  Cardiovascular:     Rate and Rhythm: Normal rate and regular  rhythm.     Pulses: Normal pulses.  Pulmonary:     Effort: Pulmonary effort is normal.  Abdominal:     Palpations: Abdomen is soft.     Tenderness: There is no abdominal tenderness.  Musculoskeletal:        General: Normal range of motion.     Cervical back: Normal range of motion. No spinous process tenderness or muscular tenderness.  Skin:    General: Skin is warm.     Capillary Refill: Capillary refill takes less than 2 seconds.  Neurological:     General: No focal deficit present.     Mental Status: She is alert and oriented to person, place, and time.     GCS: GCS eye subscore is 4. GCS verbal subscore is 5. GCS motor subscore is 6.     Sensory: Sensation is intact. No sensory deficit.     Motor: Motor function is intact. No weakness.     Coordination: Coordination is intact.     Gait: Gait is intact.     Comments: CN II through XII intact.  Speech clear.  No pronator drift.  No facial droop.  Patient ambulatory without ataxia.  Normal finger-nose testing.  Negative Romberg     ED Results / Procedures / Treatments   Labs (all labs ordered are listed, but only abnormal results are displayed) Labs Reviewed  CBC WITH DIFFERENTIAL/PLATELET - Abnormal; Notable for the following components:      Result Value   RBC 5.14 (*)    Hemoglobin 15.1 (*)    All other components within normal limits  BASIC METABOLIC PANEL - Abnormal; Notable for the following components:   Glucose, Bld 158 (*)    All other components within normal limits    EKG None  Radiology CT VENOGRAM HEAD Result Date: 07/17/2023 CLINICAL DATA:  Dural venous sinus thrombosis suspected. Blurred vision beginning today. Headache. EXAM: CT VENOGRAM HEAD TECHNIQUE: Venographic phase images of the brain were obtained following the administration of intravenous contrast. Multiplanar reformats and maximum intensity projections were generated. RADIATION DOSE REDUCTION: This exam was performed according to the departmental  dose-optimization program which includes automated exposure control, adjustment of the mA and/or kV according to patient size and/or use of iterative reconstruction technique. CONTRAST:  75mL OMNIPAQUE IOHEXOL 300 MG/ML  SOLN COMPARISON:  03/13/2014 FINDINGS: Brain has normal appearance without evidence of old or acute infarction, mass lesion, hemorrhage, hydrocephalus or extra-axial collection. Calvarium and skull base are normal. Sinuses, middle ears and mastoid air cells are clear. Orbits negative. Superior sagittal sinus is patent. Both transverse sinuses are patent, with the right being dominant. Both sigmoid sinuses are patent. Both jugular veins show flow. IMPRESSION: Normal CT venogram of the head. No evidence of dural venous sinus thrombosis. Normal CT appearance of the brain itself. Electronically Signed   By: Paulina Fusi  M.D.   On: 07/17/2023 20:43    Procedures Procedures    Medications Ordered in ED Medications - No data to display  ED Course/ Medical Decision Making/ A&P                                 Medical Decision Making Patient here with complaint of left-sided facial tingling and tingling of her left arm symptoms accompanied by headache.  Tingling spontaneously resolved after 30 minutes.  Patient continues to have frontal headache.  Denies thunderclap headache.  No recent illness or fever.  Improvement of headache after taking Tylenol prior to arrival  Here, patient ambulatory in the department with steady gait.  I do not appreciate any focal neurodeficits on my exam.   Differential would include but not limited to atypical migraine headache, stress reaction, TIA, venous sinus thrombosis  Amount and/or Complexity of Data Reviewed Labs: ordered.    Details: Labs unremarkable Radiology: ordered.    Details: CT head normal CT appearance of the brain  CT venogram head, normal CT venogram.  No evidence of dural venous sinus thrombosis Discussion of management or test  interpretation with external provider(s): Discussed findings with the patient.  Appropriate for discharge home.  She will follow-up closely outpatient with PCP. Return precautions also given   Risk Prescription drug management.           Final Clinical Impression(s) / ED Diagnoses Final diagnoses:  Headache disorder    Rx / DC Orders ED Discharge Orders     None         Pauline Aus, PA-C 07/17/23 2144

## 2023-07-17 NOTE — Discharge Instructions (Signed)
Your blood work and CT scans this evening are reassuring.  Recommend that you take over-the-counter Tylenol and/or ibuprofen as directed if needed.  Please follow-up with your primary care provider for recheck.  Return to the emergency department for any new or worsening symptoms.

## 2024-02-02 ENCOUNTER — Encounter: Payer: Self-pay | Admitting: Radiology

## 2024-03-11 ENCOUNTER — Encounter (HOSPITAL_COMMUNITY): Payer: Self-pay

## 2024-03-11 ENCOUNTER — Emergency Department (HOSPITAL_COMMUNITY): Admission: EM | Admit: 2024-03-11 | Discharge: 2024-03-12

## 2024-03-11 ENCOUNTER — Other Ambulatory Visit: Payer: Self-pay

## 2024-03-11 ENCOUNTER — Emergency Department (HOSPITAL_COMMUNITY)

## 2024-03-11 DIAGNOSIS — R519 Headache, unspecified: Secondary | ICD-10-CM | POA: Insufficient documentation

## 2024-03-11 DIAGNOSIS — R002 Palpitations: Secondary | ICD-10-CM | POA: Diagnosis not present

## 2024-03-11 DIAGNOSIS — Z5321 Procedure and treatment not carried out due to patient leaving prior to being seen by health care provider: Secondary | ICD-10-CM | POA: Insufficient documentation

## 2024-03-11 DIAGNOSIS — R42 Dizziness and giddiness: Secondary | ICD-10-CM | POA: Insufficient documentation

## 2024-03-11 LAB — CBC WITH DIFFERENTIAL/PLATELET
Abs Immature Granulocytes: 0.01 K/uL (ref 0.00–0.07)
Basophils Absolute: 0 K/uL (ref 0.0–0.1)
Basophils Relative: 1 %
Eosinophils Absolute: 0.2 K/uL (ref 0.0–0.5)
Eosinophils Relative: 3 %
HCT: 41.8 % (ref 36.0–46.0)
Hemoglobin: 14 g/dL (ref 12.0–15.0)
Immature Granulocytes: 0 %
Lymphocytes Relative: 33 %
Lymphs Abs: 2.4 K/uL (ref 0.7–4.0)
MCH: 29.4 pg (ref 26.0–34.0)
MCHC: 33.5 g/dL (ref 30.0–36.0)
MCV: 87.8 fL (ref 80.0–100.0)
Monocytes Absolute: 0.4 K/uL (ref 0.1–1.0)
Monocytes Relative: 6 %
Neutro Abs: 4.1 K/uL (ref 1.7–7.7)
Neutrophils Relative %: 57 %
Platelets: 209 K/uL (ref 150–400)
RBC: 4.76 MIL/uL (ref 3.87–5.11)
RDW: 11.9 % (ref 11.5–15.5)
WBC: 7.2 K/uL (ref 4.0–10.5)
nRBC: 0 % (ref 0.0–0.2)

## 2024-03-11 LAB — BASIC METABOLIC PANEL WITH GFR
Anion gap: 12 (ref 5–15)
BUN: 5 mg/dL — ABNORMAL LOW (ref 6–20)
CO2: 28 mmol/L (ref 22–32)
Calcium: 8.8 mg/dL — ABNORMAL LOW (ref 8.9–10.3)
Chloride: 98 mmol/L (ref 98–111)
Creatinine, Ser: 0.75 mg/dL (ref 0.44–1.00)
GFR, Estimated: >60 mL/min (ref >60)
Glucose, Bld: 107 mg/dL — ABNORMAL HIGH (ref 70–99)
Potassium: 3.8 mmol/L (ref 3.5–5.1)
Sodium: 138 mmol/L (ref 135–145)

## 2024-03-11 LAB — TROPONIN I (HIGH SENSITIVITY): Troponin I (High Sensitivity): 3 ng/L (ref <18)

## 2024-03-11 NOTE — ED Notes (Signed)
 Called pt 3x, and received no response.

## 2024-03-11 NOTE — ED Provider Triage Note (Signed)
 Emergency Medicine Provider Triage Evaluation Note  Valerie [name] , a 43 y.o. female  was evaluated in triage.  Pt complains of episode of palpitations, diaphoresis.  Patient reports history of SVT, status post ablation.  She has been off of beta-blocker for the past couple of days, going to pick this up later in the week.  She reports having an anger episode at work.  Afterwards she developed a fast heart rate, palpitations.  She felt uncomfortable but denies chest pain or shortness of breath during this time.  Symptoms have eased, however she still has a frontal headache. Patient denies signs of stroke including: facial droop, slurred speech, aphasia, weakness/numbness in extremities, imbalance/trouble walking.  She presented to make sure everything was okay with her heart.   Review of Systems  Positive: Palpitations Negative: Syncope  Physical Exam  Ht 5' 7 (1.702 m)   Wt 95.3 kg   LMP 03/09/2018   BMI 32.89 kg/m  Gen:   Awake, no distress   Resp:  Normal effort  MSK:   Moves extremities without difficulty  Other:  Regular rhythm, no murmur  Medical Decision Making  Medically screening exam initiated at 6:18 PM.  Appropriate orders placed.  Valerie [name] was informed that the remainder of the evaluation will be completed by another provider, this initial triage assessment does not replace that evaluation, and the importance of remaining in the ED until their evaluation is complete.     Valerie Chew, PA-C 03/11/24 8180

## 2024-03-11 NOTE — ED Triage Notes (Signed)
 Pt c/o palpations 30 min ago, and dizziness/headache/SHOB. Denies CP.

## 2024-04-15 ENCOUNTER — Encounter: Payer: Self-pay | Admitting: Radiology

## 2024-06-08 ENCOUNTER — Ambulatory Visit: Admission: EM | Admit: 2024-06-08 | Discharge: 2024-06-08 | Disposition: A | Discharge status: Home / Self Care

## 2024-06-08 DIAGNOSIS — M25472 Effusion, left ankle: Secondary | ICD-10-CM | POA: Diagnosis not present

## 2024-06-08 NOTE — Discharge Instructions (Signed)
 Reduce your sodium and processed food intake as best as possible, wear compression stockings while on your feet, elevate your legs at rest, drink plenty of water.  Follow-up with primary care if not improving or worsening

## 2024-06-08 NOTE — ED Triage Notes (Signed)
 Pt reports bilateral foot swelling, left is more swollen than the right has been resting and elevating them but the swelling has not gone down.

## 2024-06-09 NOTE — ED Provider Notes (Signed)
 " [facility]    CSN: [id number] Arrival date & time: 06/08/24  1321      History   Chief Complaint No chief complaint on file.   HPI Valerie [name] is a 43 y.o. female.   Patient presenting today with several day history of bilateral foot and ankle swelling, worse on the left.  Denies discoloration, numbness, tingling, pain, chest tightness, orthopnea, shortness of breath, palpitations, dizziness, injury to either foot.  Denies past history of similar issues, dietary changes, new activities or medications.  So far trying elevating her legs in the evenings with minimal relief.  Does note that they have gotten a bit better since onset.    Past Medical History:  Diagnosis Date   Anxiety    Asthma    Bacterial vaginosis    Bronchitis    Diabetes (HCC)    High cholesterol    Hypertension    Overweight    SVT (supraventricular tachycardia)     Patient Active Problem List   Diagnosis Date Noted   Carpal tunnel syndrome, right upper limb 10/24/2022   Carpal tunnel syndrome, left upper limb 09/29/2022   Bilateral hand numbness 08/19/2022   Chondromalacia patellae, right knee 12/26/2019   Lipoma of left shoulder 10/17/2019   Anterior knee pain, right 08/26/2019   Asthma 06/15/2016   Type 2 diabetes mellitus (HCC) 06/15/2016   Other abnormal glucose 06/28/2015   Hidradenitis 06/28/2015    Past Surgical History:  Procedure Laterality Date   CARPAL TUNNEL RELEASE Right 10/24/2022   Procedure: RIGHT CARPAL TUNNEL RELEASE;  Surgeon: Barbarann Oneil BROCKS, MD;  Location: [facility];  Service: Orthopedics;  Laterality: Right;   CARPAL TUNNEL RELEASE Left 12/05/2022   Procedure: LEFT CARPAL TUNNEL RELEASE;  Surgeon: Barbarann Oneil BROCKS, MD;  Location: [facility];  Service: Orthopedics;  Laterality: Left;   CESAREAN SECTION     INDUCED ABORTION     SVT ABLATION N/A 12/06/2018   Procedure: SVT ABLATION;  Surgeon: Kelsie Agent, MD;  Location: [facility];  Service: Cardiovascular;  Laterality: N/A;   TONSILLECTOMY     TUBAL LIGATION      OB History     Gravida  3   Para  2   Term  2   Preterm      AB  1   Living  2      SAB      IAB  1   Ectopic      Multiple      Live Births               Home Medications    Prior to Admission medications  Medication Sig Start Date End Date Taking? Authorizing Provider  escitalopram (LEXAPRO) 10 MG tablet Take 10 mg by mouth daily. 05/27/24  Yes [provider]  albuterol  (PROVENTIL  HFA;VENTOLIN  HFA) 108 (90 BASE) MCG/ACT inhaler Inhale 1-2 puffs into the lungs every 6 (six) hours as needed for wheezing or shortness of breath. 06/26/14   Jamelle Lorrayne HERO, NP  ALPRAZolam (XANAX) 1 MG tablet Take 1 mg by mouth 3 (three) times daily as needed for anxiety.    [provider]  atorvastatin (LIPITOR) 10 MG tablet Take 10 mg by mouth daily.    [provider]  azithromycin  (ZITHROMAX ) 250 MG tablet Take first 2 tablets together, then 1 every day until finished. 06/20/23   Stuart Vernell Norris, PA-C  JARDIANCE 10 MG TABS tablet Take  10 mg by mouth daily. 07/22/22   [provider]  metFORMIN (GLUCOPHAGE) 1000 MG tablet Take 500 mg by mouth daily with breakfast.    [provider]  metoprolol  succinate (TOPROL -XL) 25 MG 24 hr tablet Take 2 tablets (50 mg total) by mouth every morning. 09/25/20   Richarda Prentice LITTIE Mickey., NP  promethazine -dextromethorphan (PROMETHAZINE -DM) 6.25-15 MG/5ML syrup Take 5 mLs by mouth 4 (four) times daily as needed. 06/20/23   Stuart Vernell Norris, PA-C    Family History Family History  Problem Relation Age of Onset   Diabetes Mother    Hypertension Mother    Diabetes Other    Hypertension Other     Social History Social History[1]   Allergies   Patient has no known allergies.   Review of Systems Review of Systems Per HPI  Physical Exam Triage Vital Signs ED Triage Vitals  Encounter Vitals  Group     BP 06/08/24 1457 135/79     Girls Systolic BP Percentile --      Girls Diastolic BP Percentile --      Boys Systolic BP Percentile --      Boys Diastolic BP Percentile --      Pulse Rate 06/08/24 1457 67     Resp 06/08/24 1457 20     Temp 06/08/24 1457 98.7 F (37.1 C)     Temp Source 06/08/24 1457 Oral     SpO2 06/08/24 1457 96 %     Weight --      Height --      Head Circumference --      Peak Flow --      Pain Score 06/08/24 1459 5     Pain Loc --      Pain Education --      Exclude from Growth Chart --    No data found.  Updated Vital Signs BP 135/79 (BP Location: Right Arm)   Pulse 67   Temp 98.7 F (37.1 C) (Oral)   Resp 20   LMP 03/09/2018   SpO2 96%   Visual Acuity Right Eye Distance:   Left Eye Distance:   Bilateral Distance:    Right Eye Near:   Left Eye Near:    Bilateral Near:     Physical Exam Vitals and nursing note reviewed.  Constitutional:      Appearance: Normal appearance. She is not ill-appearing.  HENT:     Head: Atraumatic.  Eyes:     Extraocular Movements: Extraocular movements intact.     Conjunctiva/sclera: Conjunctivae normal.  Cardiovascular:     Rate and Rhythm: Normal rate and regular rhythm.     Heart sounds: Normal heart sounds.  Pulmonary:     Effort: Pulmonary effort is normal.     Breath sounds: Normal breath sounds.  Musculoskeletal:        General: Swelling present. No tenderness, deformity or signs of injury. Normal range of motion.     Cervical back: Normal range of motion and neck supple.     Comments: Trace edema bilateral ankles, slightly more appreciable on the left ankle.  Nontender to palpation  Skin:    General: Skin is warm and dry.     Findings: No bruising or erythema.  Neurological:     Mental Status: She is alert and oriented to person, place, and time.     Comments: Bilateral lower extremities neurovascularly intact  Psychiatric:        Mood and Affect: Mood normal.  Thought Content:  Thought content normal.        Judgment: Judgment normal.      UC Treatments / Results  Labs (all labs ordered are listed, but only abnormal results are displayed) Labs Reviewed - No data to display  EKG   Radiology No results found.  Procedures Procedures (including critical care time)  Medications Ordered in UC Medications - No data to display  Initial Impression / Assessment and Plan / UC Course  I have reviewed the triage vital signs and the nursing notes.  Pertinent labs & imaging results that were available during my care of the patient were reviewed by me and considered in my medical decision making (see chart for details).     Vital signs and exam very reassuring today, edema is very slight and likely will be improved with lifestyle modifications and compression stockings.  Discussed compression, elevation, low-sodium diet, increasing water intake, exercise.  PCP follow-up for recheck if unresolving  Final Clinical Impressions(s) / UC Diagnoses   Final diagnoses:  Edema of left ankle     Discharge Instructions      Reduce your sodium and processed food intake as best as possible, wear compression stockings while on your feet, elevate your legs at rest, drink plenty of water.  Follow-up with primary care if not improving or worsening    ED Prescriptions   None    PDMP not reviewed this encounter.    [1]  Social History Tobacco Use   Smoking status: Every Day    Current packs/day: 1.00    Types: Cigarettes   Smokeless tobacco: Never   Tobacco comments:    almost 1 pack daily  Vaping Use   Vaping status: Never Used  Substance Use Topics   Alcohol use: Yes    Comment: rare   Drug use: No     Stuart Vernell Norris, PA-C 06/09/24 1304  "
